# Patient Record
Sex: Female | Born: 1955
Health system: Southern US, Community
[De-identification: ages and names within clinical notes are randomized; demographics above are authoritative.]

## PROBLEM LIST (undated history)

## (undated) DIAGNOSIS — I10 Essential (primary) hypertension: Secondary | ICD-10-CM

## (undated) DIAGNOSIS — D125 Benign neoplasm of sigmoid colon: Secondary | ICD-10-CM

## (undated) DIAGNOSIS — D126 Benign neoplasm of colon, unspecified: Secondary | ICD-10-CM

## (undated) HISTORY — DX: Benign neoplasm of colon, unspecified: D12.6

## (undated) HISTORY — DX: Benign neoplasm of sigmoid colon: D12.5

## (undated) HISTORY — PX: BUNIONECTOMY: SHX129

---

## 1982-06-09 HISTORY — PX: HAND SURGERY: SHX662

## 2006-05-13 ENCOUNTER — Ambulatory Visit: Payer: Self-pay | Admitting: Family Medicine

## 2007-07-05 ENCOUNTER — Ambulatory Visit: Payer: Self-pay | Admitting: Gastroenterology

## 2009-01-16 ENCOUNTER — Ambulatory Visit: Payer: Self-pay | Admitting: Family Medicine

## 2010-06-18 LAB — TSH: TSH: 2.31 u[IU]/mL (ref <=5.90)

## 2010-07-03 ENCOUNTER — Ambulatory Visit: Payer: Self-pay | Admitting: Family Medicine

## 2012-08-02 LAB — CBC AND DIFFERENTIAL
HCT: 42 % (ref 36–46)
HEMOGLOBIN: 13.9 g/dL (ref 12.0–16.0)
PLATELETS: 214 10*3/uL (ref 150–399)
WBC: 5.8 10^3/mL

## 2012-09-16 ENCOUNTER — Ambulatory Visit: Payer: Self-pay | Admitting: Family Medicine

## 2012-10-16 ENCOUNTER — Emergency Department: Payer: Self-pay | Admitting: Emergency Medicine

## 2013-10-14 ENCOUNTER — Ambulatory Visit: Payer: Self-pay | Admitting: Family Medicine

## 2014-08-16 LAB — HM PAP SMEAR: HM PAP: NEGATIVE

## 2014-08-18 LAB — LIPID PANEL
Cholesterol: 195 mg/dL (ref 0–200)
HDL: 53 mg/dL (ref 35–70)
LDL CALC: 127 mg/dL
Triglycerides: 75 mg/dL (ref 40–160)

## 2014-08-18 LAB — BASIC METABOLIC PANEL
BUN: 10 mg/dL (ref 4–21)
Creatinine: 0.8 mg/dL (ref 0.5–1.1)
GLUCOSE: 94 mg/dL
Potassium: 4.1 mmol/L (ref 3.4–5.3)
Sodium: 145 mmol/L (ref 137–147)

## 2014-09-15 ENCOUNTER — Ambulatory Visit
Admit: 2014-09-15 | Disposition: A | Discharge status: Home / Self Care | Payer: Self-pay | Attending: Gastroenterology | Admitting: Gastroenterology

## 2014-09-15 LAB — HM COLONOSCOPY

## 2014-12-25 ENCOUNTER — Telehealth: Payer: Self-pay

## 2014-12-25 NOTE — Telephone Encounter (Signed)
-----   Message from Otilio Jefferson sent at 11/27/2014  2:08 PM EDT ----- Regarding: Re: Incoming Fax Sent: 12/25/2014  Phone Encounter: 09/08/2014  Make sure pt has been scheduled for a flex sigmoid per last colonoscopy. GF  > From: Hazyl Marseille > To: Ralene Gasparyan > Sent: 09/26/2014 3:23 PM > Left message with pt's husband for pt to return my call. GF  > From: Lucilla Lame MD > To: Terris Bodin > Sent: 09/20/2014 10:30 AM > Let the pt know the pathology of the colon polyp(s)showed an adenoma. A repeat colonoscopy is needed in 3 MONTHS.

## 2014-12-25 NOTE — Telephone Encounter (Signed)
LVM for pt to call and schedule 3 month repeat flex sigmoid per last colonoscopy. Mailed letter.

## 2014-12-25 NOTE — Telephone Encounter (Signed)
-----   Message from Otilio Jefferson sent at 11/27/2014  2:08 PM EDT ----- Regarding: Re: Incoming Fax Sent: 12/25/2014  Phone Encounter: 09/08/2014  Make sure pt has been scheduled for a flex sigmoid per last colonoscopy. GF  > From: Raegen Tarpley > To: Johannah Rozas > Sent: 09/26/2014 3:23 PM > Left message with pt's husband for pt to return my call. GF  > From: Lucilla Lame MD > To: Bronx Brogden > Sent: 09/20/2014 10:30 AM > Let the pt know the pathology of the colon polyp(s)showed an adenoma. A repeat colonoscopy is needed in 3 MONTHS.

## 2014-12-27 ENCOUNTER — Other Ambulatory Visit: Payer: Self-pay | Admitting: Family Medicine

## 2015-07-14 ENCOUNTER — Encounter: Payer: Self-pay | Admitting: Family Medicine

## 2015-07-14 ENCOUNTER — Ambulatory Visit (INDEPENDENT_AMBULATORY_CARE_PROVIDER_SITE_OTHER): Payer: BC Managed Care – PPO | Admitting: Family Medicine

## 2015-07-14 VITALS — BP 132/78 | HR 80 | Temp 98.6°F | Resp 16 | Wt 206.0 lb

## 2015-07-14 DIAGNOSIS — J0110 Acute frontal sinusitis, unspecified: Secondary | ICD-10-CM

## 2015-07-14 DIAGNOSIS — I10 Essential (primary) hypertension: Secondary | ICD-10-CM

## 2015-07-14 DIAGNOSIS — J019 Acute sinusitis, unspecified: Secondary | ICD-10-CM | POA: Insufficient documentation

## 2015-07-14 MED ORDER — AMOXICILLIN-POT CLAVULANATE 875-125 MG PO TABS
1.0000 | ORAL_TABLET | Freq: Two times a day (BID) | ORAL | Status: DC
Start: 2015-07-14 — End: 2015-08-27

## 2015-07-14 NOTE — Progress Notes (Signed)
Patient ID: Nicole Tyler, female   DOB: Apr 20, 1956, 61 y.o.   MRN: [id number]       Patient: Nicole Tyler Female    DOB: 1956/03/27   60 y.o.   MRN: [id number] Visit Date: 07/14/2015  Today's Provider: Margarita Rana, MD   Chief Complaint  Patient presents with  . Sinusitis    X 2 days.    Subjective:    Sinusitis This is a new problem. The current episode started in the past 7 days. The problem has been gradually worsening since onset. There has been no fever. The pain is mild. Associated symptoms include congestion, ear pain (faciial pain is the worrst. ), headaches (frontal. ), a hoarse voice, neck pain (hurting in her shoulder. ), sinus pressure, sneezing and a sore throat. Pertinent negatives include no chills, coughing, shortness of breath or swollen glands. Past treatments include oral decongestants. The treatment provided no relief.       No Known Allergies Previous Medications   AMLODIPINE-BENAZEPRIL (LOTREL) 5-40 MG PER CAPSULE    TAKE ONE CAPSULE BY MOUTH EVERY DAY    Review of Systems  Constitutional: Positive for fatigue. Negative for chills, activity change and appetite change.  HENT: Positive for congestion, ear pain (faciial pain is the worrst. ), hoarse voice, sinus pressure, sneezing and sore throat. Rhinorrhea: yelllow.   Respiratory: Negative for cough and shortness of breath.   Cardiovascular: Negative.   Musculoskeletal: Positive for neck pain (hurting in her shoulder. ).  Neurological: Positive for headaches (frontal. ).    Social History  Substance Use Topics  . Smoking status: Never Smoker   . Smokeless tobacco: Not on file  . Alcohol Use: No   Objective:   BP 132/78 mmHg  Pulse 80  Temp(Src) 98.6 F (37 C)  Resp 16  Wt 206 lb (93.441 kg)  SpO2 97%  Physical Exam  Constitutional: She is oriented to person, place, and time. She appears well-developed and well-nourished.  HENT:  Head: Normocephalic and atraumatic.  Right Ear: Tympanic  membrane and external ear normal.  Left Ear: Tympanic membrane and external ear normal.  Nose: Mucosal edema and rhinorrhea present. Right sinus exhibits maxillary sinus tenderness. Left sinus exhibits maxillary sinus tenderness.  Mouth/Throat: Uvula is midline and oropharynx is clear and moist.  Eyes: Conjunctivae and EOM are normal. Pupils are equal, round, and reactive to light.  Neck: Normal range of motion. Neck supple.  Cardiovascular: Normal rate and regular rhythm.   Pulmonary/Chest: Effort normal and breath sounds normal. She has no wheezes. She has no rales.  Neurological: She is alert and oriented to person, place, and time.      Assessment & Plan:     1. Acute frontal sinusitis, recurrence not specified New problem. Condition is worsening. Will start medication for better control.  Patient instructed to call back if condition worsens or does not improve.    - amoxicillin-clavulanate (AUGMENTIN) 875-125 MG tablet; Take 1 tablet by mouth 2 (two) times daily.  Dispense: 20 tablet; Refill: 0  2. Essential hypertension Stable Follow up with Dr. Caryn Section for CPE. Warned about not taking decongestants with pseudophed as may increase BP.      Margarita Rana, MD  [facility]

## 2015-07-20 DIAGNOSIS — Z8614 Personal history of Methicillin resistant Staphylococcus aureus infection: Secondary | ICD-10-CM | POA: Insufficient documentation

## 2015-07-20 DIAGNOSIS — Z86010 Personal history of colonic polyps: Secondary | ICD-10-CM | POA: Insufficient documentation

## 2015-07-20 DIAGNOSIS — L304 Erythema intertrigo: Secondary | ICD-10-CM | POA: Insufficient documentation

## 2015-08-27 ENCOUNTER — Ambulatory Visit (INDEPENDENT_AMBULATORY_CARE_PROVIDER_SITE_OTHER): Payer: BC Managed Care – PPO | Admitting: Family Medicine

## 2015-08-27 ENCOUNTER — Encounter: Payer: Self-pay | Admitting: Family Medicine

## 2015-08-27 VITALS — BP 112/74 | HR 81 | Temp 98.4°F | Resp 16 | Ht 64.0 in | Wt 208.0 lb

## 2015-08-27 DIAGNOSIS — Z0000 Encounter for general adult medical examination without abnormal findings: Secondary | ICD-10-CM

## 2015-08-27 DIAGNOSIS — Z86010 Personal history of colonic polyps: Secondary | ICD-10-CM | POA: Diagnosis not present

## 2015-08-27 DIAGNOSIS — Z23 Encounter for immunization: Secondary | ICD-10-CM | POA: Diagnosis not present

## 2015-08-27 DIAGNOSIS — E669 Obesity, unspecified: Secondary | ICD-10-CM | POA: Insufficient documentation

## 2015-08-27 DIAGNOSIS — I10 Essential (primary) hypertension: Secondary | ICD-10-CM

## 2015-08-27 NOTE — Progress Notes (Signed)
Patient: Nicole Tyler, Female    DOB: Jan 26, 1956, 60 y.o.   MRN: [id number] Visit Date: 08/27/2015  Today's Provider: Lelon Huh, MD   Chief Complaint  Patient presents with  . Annual Exam  . Hypertension   Subjective:    Annual physical exam Nicole Tyler is a 60 y.o. female who presents today for health maintenance and complete physical. She feels well. She reports exercising none. She reports she is sleeping fairly well.  -----------------------------------------------------------------      Hypertension, follow-up:  BP Readings from Last 3 Encounters:  08/27/15 112/74  07/20/15 130/80  07/14/15 132/78    She was last seen for hypertension 1 months ago.  BP at that visit was 132/78. Management since that visit includes; no changes.She reports good compliance with treatment. She is not having side effects. none  She is not exercising. She is not adherent to low salt diet.   Outside blood pressures are normal. She is experiencing none.  Patient denies none.   Cardiovascular risk factors include none.  Use of agents associated with hypertension: none.   ----------------------------------------------------------------------    Review of Systems  Constitutional: Negative.   HENT: Negative.   Eyes: Negative.   Respiratory: Positive for cough (nearly resolved, getting over URI).   Cardiovascular: Positive for leg swelling.  Gastrointestinal: Negative.   Endocrine: Negative.   Genitourinary: Negative.   Musculoskeletal: Negative.   Skin: Negative.   Allergic/Immunologic: Negative.   Neurological: Negative.   Hematological: Negative.   Psychiatric/Behavioral: Negative.     Social History      She  reports that she has never smoked. She does not have any smokeless tobacco history on file. She reports that she does not drink alcohol.       Social History   Social History  . Marital Status: Married    Spouse Name: N/A  . Number of Children:  N/A  . Years of Education: N/A   Occupational History  .      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 Smoking status: Never Smoker   . Smokeless tobacco: None  . Alcohol Use: No  . Drug Use: None  . Sexual Activity: Not Asked   Other Topics Concern  . None   Social History Narrative    History reviewed. No pertinent past medical history.   Patient Active Problem List   Diagnosis Date Noted  . H/O adenomatous polyp of colon 07/20/2015  . Personal history of methicillin resistant Staphylococcus aureus 07/20/2015  . Eczema intertrigo 07/20/2015  . Essential (primary) hypertension 02/19/2006    Past Surgical History  Procedure Laterality Date  . Hand surgery  1984    has repair to lt hand secondary to WC injury caught in machine rollers.    Family History        Family Status  Relation Status Death Age  . Mother Deceased 41  . Father Deceased 46    Died of colon cancer  . Sister Alive   . Daughter Alive   . Maternal Grandmother Deceased   . Daughter Alive   . Sister Alive   . Sister Alive   . Sister Alive   . Sister Alive         Her family history includes Asthma in her daughter; Colon cancer in her father; Hypertension in her daughter, father, maternal grandmother, and mother; Stroke in her maternal grandmother.    No Known Allergies  Previous Medications   AMLODIPINE-BENAZEPRIL (LOTREL) 5-40 MG PER CAPSULE    TAKE ONE CAPSULE BY MOUTH EVERY DAY   ASPIRIN 81 MG TABLET    Take by mouth.    Patient Care Team: Birdie Sons, MD as PCP - General (Family Medicine)     Objective:   Vitals: BP 112/74 mmHg  Pulse 81  Temp(Src) 98.4 F (36.9 C) (Oral)  Resp 16  Ht 5\' 4"  (1.626 m)  Wt 208 lb (94.348 kg)  BMI 35.69 kg/m2   Physical Exam   General Appearance:    Alert, cooperative, no distress, appears stated age, obese  Head:    Normocephalic, without obvious abnormality, atraumatic  Eyes:    PERRL, conjunctiva/corneas  clear, EOM's intact, fundi    benign, both eyes  Ears:    Normal TM's and external ear canals, both ears  Nose:   Nares normal, septum midline, mucosa normal, no drainage    or sinus tenderness  Throat:   Lips, mucosa, and tongue normal; teeth and gums normal  Neck:   Supple, symmetrical, trachea midline, no adenopathy;    thyroid:  no enlargement/tenderness/nodules; no carotid   bruit or JVD  Back:     Symmetric, no curvature, ROM normal, no CVA tenderness  Lungs:     Clear to auscultation bilaterally, respirations unlabored  Chest Wall:    No tenderness or deformity   Heart:    Regular rate and rhythm, S1 and S2 normal, no murmur, rub   or gallop  Breast Exam:    normal appearance, no masses or tenderness  Abdomen:     Soft, non-tender, bowel sounds active all four quadrants,    no masses, no organomegaly  Pelvic:    deferred  Extremities:   Extremities normal, atraumatic, no cyanosis or edema  Pulses:   2+ and symmetric all extremities  Skin:   Skin color, texture, turgor normal, no rashes or lesions  Lymph nodes:   Cervical, supraclavicular, and axillary nodes normal  Neurologic:   CNII-XII intact, normal strength, sensation and reflexes    throughout    Depression Screen PHQ 2/9 Scores 08/27/2015  PHQ - 2 Score 0  PHQ- 9 Score 2      Assessment & Plan:     Routine Health Maintenance and Physical Exam  Exercise Activities and Dietary recommendations Goals    None      Immunization History  Administered Date(s) Administered  . Tdap 08/16/2014    Health Maintenance  Topic Date Due  . Hepatitis C Screening  03/26/1956  . HIV Screening  07/24/1970  . MAMMOGRAM  07/24/2005  . ZOSTAVAX  07/25/2015  . INFLUENZA VACCINE  02/06/2017 (Originally 01/08/2015)  . COLONOSCOPY  09/16/2014  . PAP SMEAR  08/16/2019  . TETANUS/TDAP  08/15/2024      Discussed health benefits of physical activity, and encouraged her to engage in regular exercise appropriate for her age and  condition.    --------------------------------------------------------------------  1. Annual physical exam  - Comprehensive metabolic panel - Lipid panel  2. Essential hypertension Well controlled.  Continue current medications.   - Lipid panel - EKG 12-Lead  3. H/O adenomatous polyp of colon She was supposed to have follow up flex sig last summer, but was not scheduled. Will refer back to GI to determine if she needs follow up Sig or colonoscopy.  - Ambulatory referral to Gastroenterology  4. Need for shingles vaccine  - Varicella-zoster vaccine subcutaneous  5. Obesity D&E

## 2015-08-28 LAB — COMPREHENSIVE METABOLIC PANEL
ALT: 15 IU/L (ref 0–32)
AST: 13 IU/L (ref 0–40)
Albumin/Globulin Ratio: 1.3 (ref 1.2–2.2)
Albumin: 4.1 g/dL (ref 3.6–4.8)
Alkaline Phosphatase: 73 IU/L (ref 39–117)
BILIRUBIN TOTAL: 0.5 mg/dL (ref 0.0–1.2)
BUN/Creatinine Ratio: 11 (ref 11–26)
BUN: 8 mg/dL (ref 8–27)
CALCIUM: 9.3 mg/dL (ref 8.7–10.3)
CHLORIDE: 103 mmol/L (ref 96–106)
CO2: 24 mmol/L (ref 18–29)
Creatinine, Ser: 0.73 mg/dL (ref 0.57–1.00)
GFR, EST AFRICAN AMERICAN: 104 mL/min/{1.73_m2} (ref >59)
GFR, EST NON AFRICAN AMERICAN: 90 mL/min/{1.73_m2} (ref >59)
GLUCOSE: 87 mg/dL (ref 65–99)
Globulin, Total: 3.2 g/dL (ref 1.5–4.5)
Potassium: 4.3 mmol/L (ref 3.5–5.2)
Sodium: 143 mmol/L (ref 134–144)
Total Protein: 7.3 g/dL (ref 6.0–8.5)

## 2015-08-28 LAB — LIPID PANEL
Chol/HDL Ratio: 3.8 ratio units (ref 0.0–4.4)
Cholesterol, Total: 207 mg/dL — ABNORMAL HIGH (ref 100–199)
HDL: 55 mg/dL (ref >39)
LDL Calculated: 134 mg/dL — ABNORMAL HIGH (ref 0–99)
TRIGLYCERIDES: 91 mg/dL (ref 0–149)
VLDL CHOLESTEROL CAL: 18 mg/dL (ref 5–40)

## 2015-08-31 ENCOUNTER — Telehealth: Payer: Self-pay

## 2015-08-31 NOTE — Telephone Encounter (Signed)
Gastroenterology Pre-Procedure Review  Request Date:  Requesting Physician: Dr. Caryn Section   PATIENT REVIEW QUESTIONS: The patient responded to the following health history questions as indicated:    1. Are you having any GI issues? no 2. Do you have a personal history of Polyps? yes (64mm in 09/2014) 3. Do you have a family history of Colon Cancer or Polyps? no 4. Diabetes Mellitus? no 5. Joint replacements in the past 12 months?no 6. Major health problems in the past 3 months?no 7. Any artificial heart valves, MVP, or defibrillator?no    MEDICATIONS & ALLERGIES:    Patient reports the following regarding taking any anticoagulation/antiplatelet therapy:   Plavix, Coumadin, Eliquis, Xarelto, Lovenox, Pradaxa, Brilinta, or Effient? no Aspirin? no  Patient confirms/reports the following medications:  Current Outpatient Prescriptions  Medication Sig Dispense Refill  . amLODipine-benazepril (LOTREL) 5-40 MG per capsule TAKE ONE CAPSULE BY MOUTH EVERY DAY 30 capsule 12  . aspirin 81 MG tablet Take by mouth.     No current facility-administered medications for this visit.    Patient confirms/reports the following allergies:  No Known Allergies  No orders of the defined types were placed in this encounter.    AUTHORIZATION INFORMATION Primary Insurance: 1D#: Group #:  Secondary Insurance: 1D#: Group #:  SCHEDULE INFORMATION: Date: TBD Time: Location:

## 2015-09-13 ENCOUNTER — Encounter: Payer: Self-pay | Admitting: *Deleted

## 2015-09-13 ENCOUNTER — Other Ambulatory Visit: Payer: Self-pay

## 2015-09-13 NOTE — Discharge Instructions (Signed)

## 2015-09-17 ENCOUNTER — Encounter
Admission: RE | Disposition: A | Discharge status: Home / Self Care | Payer: Self-pay | Source: Ambulatory Visit | Attending: Gastroenterology

## 2015-09-17 ENCOUNTER — Ambulatory Visit: Payer: BC Managed Care – PPO | Admitting: Anesthesiology

## 2015-09-17 ENCOUNTER — Ambulatory Visit
Admission: RE | Admit: 2015-09-17 | Discharge: 2015-09-17 | Disposition: A | Discharge status: Home / Self Care | Payer: BC Managed Care – PPO | Source: Ambulatory Visit | Attending: Gastroenterology | Admitting: Gastroenterology

## 2015-09-17 DIAGNOSIS — Z79899 Other long term (current) drug therapy: Secondary | ICD-10-CM | POA: Diagnosis not present

## 2015-09-17 DIAGNOSIS — Z860100 Personal history of colon polyps, unspecified: Secondary | ICD-10-CM | POA: Insufficient documentation

## 2015-09-17 DIAGNOSIS — I10 Essential (primary) hypertension: Secondary | ICD-10-CM | POA: Diagnosis not present

## 2015-09-17 DIAGNOSIS — K620 Anal polyp: Secondary | ICD-10-CM | POA: Diagnosis not present

## 2015-09-17 DIAGNOSIS — D125 Benign neoplasm of sigmoid colon: Secondary | ICD-10-CM

## 2015-09-17 DIAGNOSIS — D126 Benign neoplasm of colon, unspecified: Secondary | ICD-10-CM | POA: Insufficient documentation

## 2015-09-17 DIAGNOSIS — Z1211 Encounter for screening for malignant neoplasm of colon: Secondary | ICD-10-CM | POA: Diagnosis not present

## 2015-09-17 DIAGNOSIS — Z800 Family history of malignant neoplasm of digestive organs: Secondary | ICD-10-CM | POA: Diagnosis not present

## 2015-09-17 HISTORY — PX: FLEXIBLE SIGMOIDOSCOPY: SHX5431

## 2015-09-17 HISTORY — PX: POLYPECTOMY: SHX149

## 2015-09-17 HISTORY — DX: Essential (primary) hypertension: I10

## 2015-09-17 SURGERY — SIGMOIDOSCOPY, FLEXIBLE
Anesthesia: Monitor Anesthesia Care | Wound class: Contaminated

## 2015-09-17 MED ORDER — LACTATED RINGERS IV SOLN
INTRAVENOUS | Status: DC
  Administered 2015-09-17: 10:00:00 via INTRAVENOUS

## 2015-09-17 MED ORDER — PROPOFOL 10 MG/ML IV BOLUS
INTRAVENOUS | Status: DC | PRN
  Administered 2015-09-17 (×2): 20 mg via INTRAVENOUS
  Administered 2015-09-17: 10 mg via INTRAVENOUS
  Administered 2015-09-17: 30 mg via INTRAVENOUS
  Administered 2015-09-17: 20 mg via INTRAVENOUS
  Administered 2015-09-17: 10 mg via INTRAVENOUS
  Administered 2015-09-17 (×3): 20 mg via INTRAVENOUS
  Administered 2015-09-17: 60 mg via INTRAVENOUS
  Administered 2015-09-17 (×2): 20 mg via INTRAVENOUS
  Administered 2015-09-17: 10 mg via INTRAVENOUS

## 2015-09-17 MED ORDER — SODIUM CHLORIDE 0.9 % IV SOLN: INTRAVENOUS | Status: DC

## 2015-09-17 MED ORDER — SODIUM CHLORIDE 0.9 % IJ SOLN
INTRAMUSCULAR | Status: DC | PRN
  Administered 2015-09-17: 12 mL via INTRAVENOUS

## 2015-09-17 MED ORDER — LIDOCAINE HCL (CARDIAC) 20 MG/ML IV SOLN
INTRAVENOUS | Status: DC | PRN
  Administered 2015-09-17: 40 mg via INTRAVENOUS

## 2015-09-17 SURGICAL SUPPLY — 22 items
CANISTER SUCT 1200ML W/VALVE (MISCELLANEOUS) ×4 IMPLANT
CLIP HMST 235XBRD CATH ROT (MISCELLANEOUS) ×2 IMPLANT
CLIP RESOLUTION 360 11X235 (MISCELLANEOUS) ×2
FCP ESCP3.2XJMB 240X2.8X (MISCELLANEOUS)
FORCEPS BIOP RAD 4 LRG CAP 4 (CUTTING FORCEPS) IMPLANT
FORCEPS BIOP RJ4 240 W/NDL (MISCELLANEOUS)
FORCEPS ESCP3.2XJMB 240X2.8X (MISCELLANEOUS) IMPLANT
GOWN CVR UNV OPN BCK APRN NK (MISCELLANEOUS) ×4 IMPLANT
GOWN ISOL THUMB LOOP REG UNIV (MISCELLANEOUS) ×4
INJECTOR VARIJECT VIN23 (MISCELLANEOUS) ×2 IMPLANT
KIT DEFENDO VALVE AND CONN (KITS) IMPLANT
KIT ENDO PROCEDURE OLY (KITS) ×4 IMPLANT
MARKER SPOT ENDO TATTOO 5ML (MISCELLANEOUS) IMPLANT
PAD GROUND ADULT SPLIT (MISCELLANEOUS) ×4 IMPLANT
PROBE APC STR FIRE (PROBE) ×4 IMPLANT
SNARE SHORT THROW 13M SML OVAL (MISCELLANEOUS) ×4 IMPLANT
SNARE SHORT THROW 30M LRG OVAL (MISCELLANEOUS) IMPLANT
SNARE SNG USE RND 15MM (INSTRUMENTS) ×4 IMPLANT
SPOT EX ENDOSCOPIC TATTOO (MISCELLANEOUS)
VARIJECT INJECTOR VIN23 (MISCELLANEOUS) ×4
WATER STERILE IRR 250ML POUR (IV SOLUTION) ×4 IMPLANT
WIDE-EYE POLYPTRAP (MISCELLANEOUS) ×4 IMPLANT

## 2015-09-17 NOTE — Anesthesia Postprocedure Evaluation (Signed)
Anesthesia Post Note  Patient: Nicole Tyler  Procedure(s) Performed: Procedure(s) (LRB): FLEXIBLE SIGMOIDOSCOPY (N/A) POLYPECTOMY INTESTINAL  Patient location during evaluation: PACU Anesthesia Type: MAC Level of consciousness: awake and alert Pain management: pain level controlled Vital Signs Assessment: post-procedure vital signs reviewed and stable Respiratory status: spontaneous breathing and respiratory function stable Cardiovascular status: stable Anesthetic complications: no    Jaci Standard, III,  Esbeidy Mclaine D

## 2015-09-17 NOTE — Transfer of Care (Signed)
Immediate Anesthesia Transfer of Care Note  Patient: Nicole Tyler  Procedure(s) Performed: Procedure(s) with comments: FLEXIBLE SIGMOIDOSCOPY (N/A) POLYPECTOMY INTESTINAL - Sigmoid colon polyp ( several pieces from the same polyp)  Hot snare  Patient Location: PACU  Anesthesia Type: MAC  Level of Consciousness: awake, alert  and patient cooperative  Airway and Oxygen Therapy: Patient Spontanous Breathing and Patient connected to supplemental oxygen  Post-op Assessment: Post-op Vital signs reviewed, Patient's Cardiovascular Status Stable, Respiratory Function Stable, Patent Airway and No signs of Nausea or vomiting  Post-op Vital Signs: Reviewed and stable  Complications: No apparent anesthesia complications

## 2015-09-17 NOTE — Anesthesia Procedure Notes (Signed)
Procedure Name: MAC Performed by: Lazer Wollard Pre-anesthesia Checklist: Patient identified, Emergency Drugs available, Suction available, Timeout performed and Patient being monitored Patient Re-evaluated:Patient Re-evaluated prior to inductionOxygen Delivery Method: Nasal cannula Placement Confirmation: positive ETCO2       

## 2015-09-17 NOTE — H&P (Signed)
  [facility]  [address] [place], [place] [address] Phone: [phone] Fax : [phone]  Primary Care Physician:  Lelon Huh, MD Primary Gastroenterologist:  Dr. Allen Norris  Pre-Procedure History & Physical: HPI:  Nicole Tyler is a 60 y.o. female is here for an colonoscopy.   Past Medical History  Diagnosis Date  . Hypertension     Past Surgical History  Procedure Laterality Date  . Hand surgery  1984    has repair to lt hand secondary to WC injury caught in machine rollers.  . Bunionectomy Right     Prior to Admission medications   Medication Sig Start Date End Date Taking? Authorizing Provider  amLODipine-benazepril (LOTREL) 5-40 MG per capsule TAKE ONE CAPSULE BY MOUTH EVERY DAY 12/27/14  Yes Birdie Sons, MD    Allergies as of 09/13/2015  . (No Known Allergies)    Family History  Problem Relation Age of Onset  . Hypertension Mother   . Hypertension Father   . Colon cancer Father   . Asthma Daughter   . Hypertension Maternal Grandmother   . Stroke Maternal Grandmother   . Hypertension Daughter     Social History   Social History  . Marital Status: Married    Spouse Name: N/A  . Number of Children: N/A  . Years of Education: N/A   Occupational History  .      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 Smoking status: Never Smoker   . Smokeless tobacco: Not on file  . Alcohol Use: No  . Drug Use: Not on file  . Sexual Activity: Not on file   Other Topics Concern  . Not on file   Social History Narrative    Review of Systems: See HPI, otherwise negative ROS  Physical Exam: BP 154/76 mmHg  Pulse 64  Temp(Src) 97.5 F (36.4 C) (Temporal)  Resp 16  Ht 5\' 4"  (1.626 m)  Wt 204 lb (92.534 kg)  BMI 35.00 kg/m2  SpO2 100% General:   Alert,  pleasant and cooperative in NAD Head:  Normocephalic and atraumatic. Neck:  Supple; no masses or thyromegaly. Lungs:  Clear throughout to auscultation.      Heart:  Regular rate and rhythm. Abdomen:  Soft, nontender and nondistended. Normal bowel sounds, without guarding, and without rebound.   Neurologic:  Alert and  oriented x4;  grossly normal neurologically.  Impression/Plan: Nicole Tyler is here for an colonoscopy to be performed for large polyp in the sigmoid last year.  Risks, benefits, limitations, and alternatives regarding  colonoscopy have been reviewed with the patient.  Questions have been answered.  All parties agreeable.   Ollen Bowl, MD  09/17/2015, 10:12 AM

## 2015-09-17 NOTE — Op Note (Addendum)
[facility] Patient Name: Nicole Tyler Procedure Date: 09/17/2015 9:44 AM MRN: [id number] Account #: [id number] Date of Birth: 21-Nov-1955 Admit Type: Outpatient Age: 60 Room: [facility] OR ROOM 01 Gender: Female Note Status: Supervisor Override Procedure:            Flexible Sigmoidoscopy Indications:          High risk colon cancer surveillance: Personal history                        of colonic polyps, Patient had a large polyp found 1                        year ago and was told to come back in 3 months. patient                        was notified and did not follow up until now. Providers:            Lucilla Lame, MD Referring MD:         Kirstie Peri. Fisher MD, MD (Referring MD) Complications:        No immediate complications. Procedure:            Pre-Anesthesia Assessment:                       - Prior to the procedure, a History and Physical was                        performed, and patient medications and allergies were                        reviewed. The patient's tolerance of previous                        anesthesia was also reviewed. The risks and benefits of                        the procedure and the sedation options and risks were                        discussed with the patient. All questions were                        answered, and informed consent was obtained. Prior                        Anticoagulants: The patient has taken no previous                        anticoagulant or antiplatelet agents. ASA Grade                        Assessment: II - A patient with mild systemic disease.                        After reviewing the risks and benefits, the patient was                        deemed in satisfactory condition to undergo the  procedure.                       After obtaining informed consent, the scope was passed                        under direct vision. The Olympus GIF H180J endoscope                         (S#: [id number]) was introduced through the anus and                        advanced to the the sigmoid colon. The flexible                        sigmoidoscopy was accomplished without difficulty. The                        patient tolerated the procedure well. The quality of                        the bowel preparation was excellent. Findings:      The perianal and digital rectal examinations were normal.      A 15 mm polyp was found at 15 cm proximal to the anus. The polyp was       sessile. Area was successfully injected with saline for a lift       polypectomy. The polyp was removed with a hot snare. Resection and       retrieval were complete. One hemostatic clip was successfully placed (MR       conditional).      The tattoo from the previous polypectomy was seen. Impression:           - One 15 mm polyp at 15 cm proximal to the anus,                        removed with a hot snare. Resected and retrieved.                        Injected. Clip (MR conditional) was placed.                       - The tattoo from the previous polypectomy was seen Recommendation:       - Repeat flexible sigmoidoscopy in 3 months for                        retreatment. Procedure Code(s):    --- Professional ---                       [phone], Sigmoidoscopy, flexible; with removal of                        tumor(s), polyp(s), or other lesion(s) by snare                        technique                       [id number], Sigmoidoscopy, flexible; with directed  submucosal injection(s), any substance Diagnosis Code(s):    --- Professional ---                       Z86.010, Personal history of colonic polyps                       D12.6, Benign neoplasm of colon, unspecified CPT copyright 2016 American Medical Association. All rights reserved. The codes documented in this report are preliminary and upon coder review may  be revised to meet current compliance requirements. Lucilla Lame,  MD 09/17/2015 11:29:43 AM This report has been signed electronically. Number of Addenda: 0 Note Initiated On: 09/17/2015 9:44 AM Total Procedure Duration: 0 hours 24 minutes 17 seconds       [facility]

## 2015-09-17 NOTE — Anesthesia Preprocedure Evaluation (Signed)
Anesthesia Evaluation  Patient identified by MRN, date of birth, ID band Patient awake    Reviewed: Allergy & Precautions, H&P , NPO status , Patient's Chart, lab work & pertinent test results  Airway Mallampati: II  TM Distance: >3 FB Neck ROM: full    Dental no notable dental hx.    Pulmonary neg pulmonary ROS,    Pulmonary exam normal        Cardiovascular hypertension, On Medications Normal cardiovascular exam     Neuro/Psych    GI/Hepatic negative GI ROS, Neg liver ROS,   Endo/Other  negative endocrine ROS  Renal/GU negative Renal ROS     Musculoskeletal   Abdominal   Peds  Hematology negative hematology ROS (+)   Anesthesia Other Findings   Reproductive/Obstetrics                             Anesthesia Physical Anesthesia Plan  ASA: II  Anesthesia Plan: MAC   Post-op Pain Management:    Induction:   Airway Management Planned:   Additional Equipment:   Intra-op Plan:   Post-operative Plan:   Informed Consent: I have reviewed the patients History and Physical, chart, labs and discussed the procedure including the risks, benefits and alternatives for the proposed anesthesia with the patient or authorized representative who has indicated his/her understanding and acceptance.     Plan Discussed with: CRNA  Anesthesia Plan Comments:         Anesthesia Quick Evaluation

## 2015-09-18 ENCOUNTER — Encounter: Payer: Self-pay | Admitting: Gastroenterology

## 2015-09-20 ENCOUNTER — Telehealth: Payer: Self-pay

## 2015-09-20 NOTE — Telephone Encounter (Signed)
-----   Message from Lucilla Lame, MD sent at 09/19/2015  8:34 AM EDT ----- Let the patient know that the tissue was the precancerous type and she needs a repeat flex sig in 3 months.

## 2015-09-20 NOTE — Telephone Encounter (Signed)
Pt notified of results

## 2015-10-22 ENCOUNTER — Encounter: Payer: Self-pay | Admitting: Gastroenterology

## 2015-10-25 ENCOUNTER — Encounter: Payer: Self-pay | Admitting: Physician Assistant

## 2015-10-25 ENCOUNTER — Ambulatory Visit (INDEPENDENT_AMBULATORY_CARE_PROVIDER_SITE_OTHER): Payer: BC Managed Care – PPO | Admitting: Physician Assistant

## 2015-10-25 VITALS — BP 140/88 | HR 105 | Temp 100.5°F | Resp 16 | Ht 64.0 in | Wt 208.0 lb

## 2015-10-25 DIAGNOSIS — H66002 Acute suppurative otitis media without spontaneous rupture of ear drum, left ear: Secondary | ICD-10-CM

## 2015-10-25 DIAGNOSIS — J0140 Acute pansinusitis, unspecified: Secondary | ICD-10-CM | POA: Diagnosis not present

## 2015-10-25 MED ORDER — AMOXICILLIN-POT CLAVULANATE 875-125 MG PO TABS: 1.0000 | ORAL_TABLET | Freq: Two times a day (BID) | ORAL | Status: DC

## 2015-10-25 NOTE — Progress Notes (Signed)
Patient: Nicole Tyler Female    DOB: 1956/03/11   59 y.o.   MRN: [id number] Visit Date: 10/25/2015  Today's Provider: Mar Daring, PA-C   Chief Complaint  Patient presents with  . Sinusitis   Subjective:    Sinusitis This is a new problem. The current episode started in the past 7 days (2 days ago). The problem has been gradually worsening since onset. The maximum temperature recorded prior to her arrival was 100.4 - 100.9 F. The fever has been present for less than 1 day. Associated symptoms include congestion, coughing, headaches, a hoarse voice, shortness of breath, sinus pressure and a sore throat. Pertinent negatives include no chills, diaphoresis, ear pain, neck pain or swollen glands. Treatments tried: allergy otc. The treatment provided no relief.   Sore throat started 2 nights ago. Symptoms of sinus pressure and burning. Cough, low-grade fever, body aches.      No Known Allergies Previous Medications   AMLODIPINE-BENAZEPRIL (LOTREL) 5-40 MG PER CAPSULE    TAKE ONE CAPSULE BY MOUTH EVERY DAY    Review of Systems  Constitutional: Negative for fever, chills, diaphoresis, appetite change and fatigue.  HENT: Positive for congestion, hoarse voice, postnasal drip, sinus pressure and sore throat. Negative for ear pain.   Respiratory: Positive for cough and shortness of breath. Negative for chest tightness.   Cardiovascular: Negative for chest pain and palpitations.  Gastrointestinal: Negative for nausea, vomiting and abdominal pain.  Musculoskeletal: Positive for myalgias. Negative for back pain and neck pain.  Neurological: Positive for headaches. Negative for dizziness and weakness.    Social History  Substance Use Topics  . Smoking status: Never Smoker   . Smokeless tobacco: Not on file  . Alcohol Use: No   Objective:   BP 140/88 mmHg  Pulse 105  Temp(Src) 100.5 F (38.1 C) (Oral)  Resp 16  Ht 5\' 4"  (1.626 m)  Wt 208 lb (94.348 kg)  BMI 35.69  kg/m2  SpO2 95%  Physical Exam  Constitutional: She appears well-developed and well-nourished. No distress.  HENT:  Head: Normocephalic and atraumatic.  Right Ear: Hearing, external ear and ear canal normal. Tympanic membrane is not erythematous and not bulging. A middle ear effusion is present.  Left Ear: Hearing, external ear and ear canal normal. Tympanic membrane is erythematous and bulging. A middle ear effusion is present.  Nose: Mucosal edema and rhinorrhea present. Right sinus exhibits maxillary sinus tenderness and frontal sinus tenderness. Left sinus exhibits maxillary sinus tenderness and frontal sinus tenderness.  Mouth/Throat: Uvula is midline and mucous membranes are normal. Posterior oropharyngeal erythema present. No oropharyngeal exudate or posterior oropharyngeal edema.  Eyes: Conjunctivae are normal. Pupils are equal, round, and reactive to light. Right eye exhibits no discharge. Left eye exhibits no discharge. No scleral icterus.  Neck: Normal range of motion. Neck supple. No tracheal deviation present. No thyromegaly present.  Cardiovascular: Normal rate, regular rhythm and normal heart sounds.  Exam reveals no gallop and no friction rub.   No murmur heard. Pulmonary/Chest: Effort normal and breath sounds normal. No stridor. No respiratory distress. She has no wheezes. She has no rales.  Lymphadenopathy:    She has no cervical adenopathy.  Skin: Skin is warm and dry. She is not diaphoretic.  Vitals reviewed.       Assessment & Plan:     1. Acute pansinusitis, recurrence not specified Worsening symptoms with fever that have not responded to over-the-counter medications. I will treat with  Augmentin as below. She may use Mucinex DM for congestion and cough. She's to make sure to stay well-hydrated and get plenty of rest. She may take Tylenol and/or ibuprofen intermittently as needed for body aches and fever. She is to call the office if symptoms fail to improve or  worsen. - amoxicillin-clavulanate (AUGMENTIN) 875-125 MG tablet; Take 1 tablet by mouth 2 (two) times daily.  Dispense: 20 tablet; Refill: 0  2. Acute suppurative otitis media of left ear without spontaneous rupture of tympanic membrane, recurrence not specified See above medical treatment plan. - amoxicillin-clavulanate (AUGMENTIN) 875-125 MG tablet; Take 1 tablet by mouth 2 (two) times daily.  Dispense: 20 tablet; Refill: 0       Mar Daring, PA-C  [facility]

## 2015-10-25 NOTE — Patient Instructions (Signed)
Otitis Media With Effusion Otitis media with effusion is the presence of fluid in the middle ear. This is a common problem in children, which often follows ear infections. It may be present for weeks or longer after the infection. Unlike an acute ear infection, otitis media with effusion refers only to fluid behind the ear drum and not infection. Children with repeated ear and sinus infections and allergy problems are the most likely to get otitis media with effusion. CAUSES  The most frequent cause of the fluid buildup is dysfunction of the eustachian tubes. These are the tubes that drain fluid in the ears to the back of the nose (nasopharynx). SYMPTOMS   The main symptom of this condition is hearing loss. As a result, you or your child may:  Listen to the TV at a loud volume.  Not respond to questions.  Ask "what" often when spoken to.  Mistake or confuse one sound or word for another.  There may be a sensation of fullness or pressure but usually not pain. DIAGNOSIS   Your health care provider will diagnose this condition by examining you or your child's ears.  Your health care provider may test the pressure in you or your child's ear with a tympanometer.  A hearing test may be conducted if the problem persists. TREATMENT   Treatment depends on the duration and the effects of the effusion.  Antibiotics, decongestants, nose drops, and cortisone-type drugs (tablets or nasal spray) may not be helpful.  Children with persistent ear effusions may have delayed language or behavioral problems. Children at risk for developmental delays in hearing, learning, and speech may require referral to a specialist earlier than children not at risk.  You or your child's health care provider may suggest a referral to an ear, nose, and throat surgeon for treatment. The following may help restore normal hearing:  Drainage of fluid.  Placement of ear tubes (tympanostomy tubes).  Removal of adenoids  (adenoidectomy). HOME CARE INSTRUCTIONS   Avoid secondhand smoke.  Infants who are breastfed are less likely to have this condition.  Avoid feeding infants while they are lying flat.  Avoid known environmental allergens.  Avoid people who are sick. SEEK MEDICAL CARE IF:   Hearing is not better in 3 months.  Hearing is worse.  Ear pain.  Drainage from the ear.  Dizziness. MAKE SURE YOU:   Understand these instructions.  Will watch your condition.  Will get help right away if you are not doing well or get worse.   This information is not intended to replace advice given to you by your health care provider. Make sure you discuss any questions you have with your health care provider.   Document Released: 07/03/2004 Document Revised: 06/16/2014 Document Reviewed: 12/21/2012 Elsevier Interactive Patient Education 2016 Elsevier Inc[employer] Sinusitis, Adult Sinusitis is redness, soreness, and inflammation of the paranasal sinuses. Paranasal sinuses are air pockets within the bones of your face. They are located beneath your eyes, in the middle of your forehead, and above your eyes. In healthy paranasal sinuses, mucus is able to drain out, and air is able to circulate through them by way of your nose. However, when your paranasal sinuses are inflamed, mucus and air can become trapped. This can allow bacteria and other germs to grow and cause infection. Sinusitis can develop quickly and last only a short time (acute) or continue over a long period (chronic). Sinusitis that lasts for more than 12 weeks is considered chronic. CAUSES Causes of sinusitis   include:  Allergies.  Structural abnormalities, such as displacement of the cartilage that separates your nostrils (deviated septum), which can decrease the air flow through your nose and sinuses and affect sinus drainage.  Functional abnormalities, such as when the small hairs (cilia) that line your sinuses and help remove mucus do not work  properly or are not present. SIGNS AND SYMPTOMS Symptoms of acute and chronic sinusitis are the same. The primary symptoms are pain and pressure around the affected sinuses. Other symptoms include:  Upper toothache.  Earache.  Headache.  Bad breath.  Decreased sense of smell and taste.  A cough, which worsens when you are lying flat.  Fatigue.  Fever.  Thick drainage from your nose, which often is green and may contain pus (purulent).  Swelling and warmth over the affected sinuses. DIAGNOSIS Your health care provider will perform a physical exam. During your exam, your health care provider may perform any of the following to help determine if you have acute sinusitis or chronic sinusitis:  Look in your nose for signs of abnormal growths in your nostrils (nasal polyps).  Tap over the affected sinus to check for signs of infection.  View the inside of your sinuses using an imaging device that has a light attached (endoscope). If your health care provider suspects that you have chronic sinusitis, one or more of the following tests may be recommended:  Allergy tests.  Nasal culture. A sample of mucus is taken from your nose, sent to a lab, and screened for bacteria.  Nasal cytology. A sample of mucus is taken from your nose and examined by your health care provider to determine if your sinusitis is related to an allergy. TREATMENT Most cases of acute sinusitis are related to a viral infection and will resolve on their own within 10 days. Sometimes, medicines are prescribed to help relieve symptoms of both acute and chronic sinusitis. These may include pain medicines, decongestants, nasal steroid sprays, or saline sprays. However, for sinusitis related to a bacterial infection, your health care provider will prescribe antibiotic medicines. These are medicines that will help kill the bacteria causing the infection. Rarely, sinusitis is caused by a fungal infection. In these cases,  your health care provider will prescribe antifungal medicine. For some cases of chronic sinusitis, surgery is needed. Generally, these are cases in which sinusitis recurs more than 3 times per year, despite other treatments. HOME CARE INSTRUCTIONS  Drink plenty of water. Water helps thin the mucus so your sinuses can drain more easily.  Use a humidifier.  Inhale steam 3-4 times a day (for example, sit in the bathroom with the shower running).  Apply a warm, moist washcloth to your face 3-4 times a day, or as directed by your health care provider.  Use saline nasal sprays to help moisten and clean your sinuses.  Take medicines only as directed by your health care provider.  If you were prescribed either an antibiotic or antifungal medicine, finish it all even if you start to feel better. SEEK IMMEDIATE MEDICAL CARE IF:  You have increasing pain or severe headaches.  You have nausea, vomiting, or drowsiness.  You have swelling around your face.  You have vision problems.  You have a stiff neck.  You have difficulty breathing.   This information is not intended to replace advice given to you by your health care provider. Make sure you discuss any questions you have with your health care provider.   Document Released: 05/26/2005 Document Revised: 06/16/2014   Document Reviewed: 06/10/2011 Elsevier Interactive Patient Education 2016 Elsevier Inc.  

## 2015-11-26 ENCOUNTER — Encounter: Payer: Self-pay | Admitting: Family Medicine

## 2015-11-26 ENCOUNTER — Ambulatory Visit
Admission: RE | Admit: 2015-11-26 | Discharge: 2015-11-26 | Disposition: A | Discharge status: Home / Self Care | Payer: BC Managed Care – PPO | Source: Ambulatory Visit | Attending: Family Medicine | Admitting: Family Medicine

## 2015-11-26 ENCOUNTER — Ambulatory Visit (INDEPENDENT_AMBULATORY_CARE_PROVIDER_SITE_OTHER): Payer: BC Managed Care – PPO | Admitting: Family Medicine

## 2015-11-26 VITALS — BP 132/84 | HR 74 | Temp 98.3°F | Resp 16 | Wt 211.0 lb

## 2015-11-26 DIAGNOSIS — M545 Low back pain: Secondary | ICD-10-CM

## 2015-11-26 DIAGNOSIS — M5137 Other intervertebral disc degeneration, lumbosacral region: Secondary | ICD-10-CM | POA: Diagnosis not present

## 2015-11-26 DIAGNOSIS — R1030 Lower abdominal pain, unspecified: Secondary | ICD-10-CM | POA: Insufficient documentation

## 2015-11-26 LAB — POCT URINALYSIS DIPSTICK
GLUCOSE UA: NEGATIVE
KETONES UA: NEGATIVE
Leukocytes, UA: NEGATIVE
Nitrite, UA: NEGATIVE
UROBILINOGEN UA: 0.2
pH, UA: 6

## 2015-11-26 NOTE — Progress Notes (Signed)
       Patient: Nicole Tyler Female    DOB: 04-04-1956   60 y.o.   MRN: [id number] Visit Date: 11/26/2015  Today's Provider: Lelon Huh, MD   Chief Complaint  Patient presents with  . Abdominal Pain   Subjective:    Abdominal Pain This is a new problem. Episode onset: 2-3 weeks ago. The problem occurs intermittently. The problem has been unchanged. Pain location: lower abdomen/ pelvic region; mostly on the left side. The pain is severe. The quality of the pain is sharp. Pertinent negatives include no anorexia, constipation, diarrhea, dysuria, fever, flatus, frequency, headaches, hematuria, melena, myalgias, nausea, vomiting or weight loss.  Has been associated with worsening low back pain. Both pains only occur when she is standing or waling, and usually strike out of the blue with no other clear triggers. Lasts several minutes then eases up.      No Known Allergies Current Meds  Medication Sig  . amLODipine-benazepril (LOTREL) 5-40 MG per capsule TAKE ONE CAPSULE BY MOUTH EVERY DAY    Review of Systems  Constitutional: Negative for fever, chills, weight loss, appetite change and fatigue.  Respiratory: Negative for chest tightness and shortness of breath.   Cardiovascular: Negative for chest pain and palpitations.  Gastrointestinal: Positive for abdominal pain. Negative for nausea, vomiting, diarrhea, constipation, melena, abdominal distention, anorexia and flatus.  Genitourinary: Negative for dysuria, frequency, hematuria and difficulty urinating.  Musculoskeletal: Positive for back pain (achiness and numbness in lower back). Negative for myalgias.  Neurological: Negative for dizziness, weakness and headaches.    Social History  Substance Use Topics  . Smoking status: Never Smoker   . Smokeless tobacco: Not on file  . Alcohol Use: No   Objective:   BP 132/84 mmHg  Pulse 74  Temp(Src) 98.3 F (36.8 C) (Oral)  Resp 16  Wt 211 lb (95.709 kg)  SpO2 97%  Physical  Exam  General Appearance:    Alert, cooperative, no distress  Eyes:    PERRL, conjunctiva/corneas clear, EOM's intact       Lungs:     Clear to auscultation bilaterally, respirations unlabored  Heart:    Regular rate and rhythm  mS:   Tender upper lumbar spine. No gross deformities   Abdomen:   bowel sounds present and normal in all 4 quadrants. No CVA tenderness     Results for orders placed or performed in visit on 11/26/15  POCT urinalysis dipstick  Result Value Ref Range   Color, UA yellow    Clarity, UA clear    Glucose, UA negative    Bilirubin, UA Small    Ketones, UA Negative    Spec Grav, UA >=1.030    Blood, UA Trace (hemolyzed)    pH, UA 6.0    Protein, UA Trace    Urobilinogen, UA 0.2    Nitrite, UA Negative    Leukocytes, UA Negative Negative       Assessment & Plan:     1. Lower abdominal pain Normal abdominal exam and u/a. Area of pain is consistent with L1 radiculopathy. Check XR as below. Consider pelvic ultrasound if XR is not consistent with lumbar spine disease.  - POCT urinalysis dipstick   2. Midline low back pain, with sciatica presence unspecified  - DG Lumbar Spine Complete; Future      Lelon Huh, MD  [facility]

## 2015-11-27 ENCOUNTER — Telehealth: Payer: Self-pay | Admitting: *Deleted

## 2015-11-27 MED ORDER — PREDNISONE 10 MG (21) PO TBPK: ORAL_TABLET | ORAL | Status: DC

## 2015-11-27 NOTE — Telephone Encounter (Signed)
Patient was notified of results. Patient expressed understanding. Rx sent to pharmacy.  

## 2015-11-27 NOTE — Telephone Encounter (Signed)
-----   Message from Birdie Sons, MD sent at 11/27/2015  3:29 PM EDT ----- Nicole Tyler shows some mild disc disease and bone spurs in spine which could be pinching nerves and causing her pain. Recommend 6 day prednisone taper. If not greatly improved in 2-3 days will need lower abdominal and pelvic ultrasound.

## 2015-12-28 ENCOUNTER — Telehealth: Payer: Self-pay

## 2015-12-28 NOTE — Telephone Encounter (Signed)
Left vm to schedule pt for her 3 month repeat flex sigmoid.

## 2015-12-28 NOTE — Telephone Encounter (Signed)
-----   Message from Nicole Tyler, Nicole Tyler sent at 09/20/2015  4:05 PM EDT ----- Pt needs 3 month flex sigmoid repeat due to large polyp in rectum

## 2015-12-30 ENCOUNTER — Other Ambulatory Visit: Payer: Self-pay | Admitting: Family Medicine

## 2016-01-03 ENCOUNTER — Telehealth: Payer: Self-pay | Admitting: Gastroenterology

## 2016-01-03 NOTE — Telephone Encounter (Signed)
Patient called to schedule an appointment for a test but she wasn't sure what it was called. She said flex something. Please call.

## 2016-01-04 NOTE — Telephone Encounter (Signed)
LVM again for pt to return my call to schedule her repeat flex sigmoid.

## 2016-01-08 ENCOUNTER — Other Ambulatory Visit: Payer: Self-pay

## 2016-01-08 NOTE — Telephone Encounter (Signed)
Pt scheduled for a Flexible sigmoidoscopy [id number]) at [facility] on 02/08/16 for hx of colon polyps Z86.010. Please precert.

## 2016-02-04 ENCOUNTER — Encounter: Payer: Self-pay | Admitting: *Deleted

## 2016-02-07 NOTE — Discharge Instructions (Signed)

## 2016-02-08 ENCOUNTER — Encounter
Admission: RE | Disposition: A | Discharge status: Home / Self Care | Payer: Self-pay | Source: Ambulatory Visit | Attending: Gastroenterology

## 2016-02-08 ENCOUNTER — Ambulatory Visit: Payer: BC Managed Care – PPO | Admitting: Anesthesiology

## 2016-02-08 ENCOUNTER — Ambulatory Visit
Admission: RE | Admit: 2016-02-08 | Discharge: 2016-02-08 | Disposition: A | Discharge status: Home / Self Care | Payer: BC Managed Care – PPO | Source: Ambulatory Visit | Attending: Gastroenterology | Admitting: Gastroenterology

## 2016-02-08 DIAGNOSIS — K6389 Other specified diseases of intestine: Secondary | ICD-10-CM | POA: Insufficient documentation

## 2016-02-08 DIAGNOSIS — Z8249 Family history of ischemic heart disease and other diseases of the circulatory system: Secondary | ICD-10-CM | POA: Insufficient documentation

## 2016-02-08 DIAGNOSIS — D125 Benign neoplasm of sigmoid colon: Secondary | ICD-10-CM | POA: Diagnosis not present

## 2016-02-08 DIAGNOSIS — Z79899 Other long term (current) drug therapy: Secondary | ICD-10-CM | POA: Diagnosis not present

## 2016-02-08 DIAGNOSIS — Z86010 Personal history of colonic polyps: Secondary | ICD-10-CM | POA: Insufficient documentation

## 2016-02-08 DIAGNOSIS — Z800 Family history of malignant neoplasm of digestive organs: Secondary | ICD-10-CM | POA: Insufficient documentation

## 2016-02-08 DIAGNOSIS — Z823 Family history of stroke: Secondary | ICD-10-CM | POA: Diagnosis not present

## 2016-02-08 DIAGNOSIS — Z825 Family history of asthma and other chronic lower respiratory diseases: Secondary | ICD-10-CM | POA: Insufficient documentation

## 2016-02-08 DIAGNOSIS — Z1211 Encounter for screening for malignant neoplasm of colon: Secondary | ICD-10-CM | POA: Insufficient documentation

## 2016-02-08 HISTORY — PX: FLEXIBLE SIGMOIDOSCOPY: SHX5431

## 2016-02-08 SURGERY — SIGMOIDOSCOPY, FLEXIBLE
Anesthesia: Monitor Anesthesia Care | Wound class: Contaminated

## 2016-02-08 MED ORDER — PROPOFOL 10 MG/ML IV BOLUS
INTRAVENOUS | Status: DC | PRN
  Administered 2016-02-08: 50 mg via INTRAVENOUS
  Administered 2016-02-08: 10 mg via INTRAVENOUS
  Administered 2016-02-08: 30 mg via INTRAVENOUS
  Administered 2016-02-08 (×2): 20 mg via INTRAVENOUS

## 2016-02-08 MED ORDER — STERILE WATER FOR IRRIGATION IR SOLN
Status: DC | PRN
  Administered 2016-02-08: 09:00:00

## 2016-02-08 MED ORDER — LACTATED RINGERS IV SOLN
INTRAVENOUS | Status: DC
  Administered 2016-02-08: 09:00:00 via INTRAVENOUS

## 2016-02-08 MED ORDER — LIDOCAINE HCL (CARDIAC) 20 MG/ML IV SOLN
INTRAVENOUS | Status: DC | PRN
  Administered 2016-02-08: 50 mg via INTRAVENOUS

## 2016-02-08 SURGICAL SUPPLY — 23 items
CANISTER SUCT 1200ML W/VALVE (MISCELLANEOUS) ×3 IMPLANT
CLIP HMST 235XBRD CATH ROT (MISCELLANEOUS) IMPLANT
CLIP RESOLUTION 360 11X235 (MISCELLANEOUS)
FCP ESCP3.2XJMB 240X2.8X (MISCELLANEOUS)
FORCEPS BIOP RAD 4 LRG CAP 4 (CUTTING FORCEPS) IMPLANT
FORCEPS BIOP RJ4 240 W/NDL (MISCELLANEOUS)
FORCEPS ESCP3.2XJMB 240X2.8X (MISCELLANEOUS) IMPLANT
GOWN CVR UNV OPN BCK APRN NK (MISCELLANEOUS) ×2 IMPLANT
GOWN ISOL THUMB LOOP REG UNIV (MISCELLANEOUS) ×4
INJECTOR VARIJECT VIN23 (MISCELLANEOUS) IMPLANT
KIT DEFENDO VALVE AND CONN (KITS) IMPLANT
KIT ENDO PROCEDURE OLY (KITS) ×3 IMPLANT
MARKER SPOT ENDO TATTOO 5ML (MISCELLANEOUS) IMPLANT
PAD GROUND ADULT SPLIT (MISCELLANEOUS) ×3 IMPLANT
PROBE APC STR FIRE (PROBE) IMPLANT
RETRIEVER NET ROTH 2.5X230 LF (MISCELLANEOUS) ×3 IMPLANT
SNARE SHORT THROW 13M SML OVAL (MISCELLANEOUS) ×3 IMPLANT
SNARE SHORT THROW 30M LRG OVAL (MISCELLANEOUS) IMPLANT
SNARE SNG USE RND 15MM (INSTRUMENTS) IMPLANT
SPOT EX ENDOSCOPIC TATTOO (MISCELLANEOUS)
TRAP ETRAP POLY (MISCELLANEOUS) ×3 IMPLANT
VARIJECT INJECTOR VIN23 (MISCELLANEOUS)
WATER STERILE IRR 250ML POUR (IV SOLUTION) ×3 IMPLANT

## 2016-02-08 NOTE — H&P (Signed)
  Lucilla Lame, MD [facility] [address] [place], [place] [address] Phone: [phone] Fax : [phone]  Primary Care Physician:  Lelon Huh, MD Primary Gastroenterologist:  Dr. Allen Norris  Pre-Procedure History & Physical: HPI:  Nicole Tyler is a 60 y.o. female is here for an flexible sigmoidoscopy.   Past Medical History:  Diagnosis Date  . Hypertension     Past Surgical History:  Procedure Laterality Date  . BUNIONECTOMY Right   . FLEXIBLE SIGMOIDOSCOPY N/A 09/17/2015   Procedure: FLEXIBLE SIGMOIDOSCOPY;  Surgeon: Lucilla Lame, MD;  Location: [facility];  Service: Endoscopy;  Laterality: N/A;  . HAND SURGERY  1984   has repair to lt hand secondary to [facility] injury caught in machine rollers.  Marland Kitchen POLYPECTOMY  09/17/2015   Procedure: POLYPECTOMY INTESTINAL;  Surgeon: Lucilla Lame, MD;  Location: [place];  Service: Endoscopy;;  Sigmoid colon polyp ( several pieces from the same polyp)      Prior to Admission medications   Medication Sig Start Date End Date Taking? Authorizing Provider  amLODipine-benazepril (LOTREL) 5-40 MG capsule TAKE ONE CAPSULE BY MOUTH EVERY DAY 12/30/15  Yes Birdie Sons, MD    Allergies as of 01/08/2016  . (No Known Allergies)    Family History  Problem Relation Age of Onset  . Hypertension Mother   . Hypertension Father   . Colon cancer Father   . Asthma Daughter   . Hypertension Maternal Grandmother   . Stroke Maternal Grandmother   . Hypertension Daughter     Social History   Social History  . Marital status: Married    Spouse name: N/A  . Number of children: N/A  . Years of education: N/A   Occupational History  .      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 Smoking status: Never Smoker  . Smokeless tobacco: Never Used  . Alcohol use No  . Drug use: No  . Sexual activity: Not on file   Other Topics Concern  . Not on file   Social History Narrative  . No narrative on file     Review of Systems: See HPI, otherwise negative ROS  Physical Exam: Ht 5\' 4"  (1.626 m)   Wt 211 lb (95.7 kg)   BMI 36.22 kg/m  General:   Alert,  pleasant and cooperative in NAD Head:  Normocephalic and atraumatic. Neck:  Supple; no masses or thyromegaly. Lungs:  Clear throughout to auscultation.    Heart:  Regular rate and rhythm. Abdomen:  Soft, nontender and nondistended. Normal bowel sounds, without guarding, and without rebound.   Neurologic:  Alert and  oriented x4;  grossly normal neurologically.  Impression/Plan: Nicole Tyler is here for an flexible sigmoidoscopy to be performed for history of polyps  Risks, benefits, limitations, and alternatives regarding  flexible sigmoidoscopy have been reviewed with the patient.  Questions have been answered.  All parties agreeable.   Lucilla Lame, MD  02/08/2016, 7:26 AM

## 2016-02-08 NOTE — Anesthesia Procedure Notes (Signed)
Procedure Name: MAC Performed by: Shaily Librizzi Pre-anesthesia Checklist: Patient identified, Emergency Drugs available, Suction available, Timeout performed and Patient being monitored Patient Re-evaluated:Patient Re-evaluated prior to inductionOxygen Delivery Method: Nasal cannula Placement Confirmation: positive ETCO2       

## 2016-02-08 NOTE — Anesthesia Preprocedure Evaluation (Signed)
Anesthesia Evaluation  Patient identified by MRN, date of birth, ID band Patient awake    Reviewed: Allergy & Precautions, NPO status , Patient's Chart, lab work & pertinent test results  Airway Mallampati: II  TM Distance: >3 FB Neck ROM: Full    Dental no notable dental hx.    Pulmonary neg pulmonary ROS,    Pulmonary exam normal        Cardiovascular hypertension, Pt. on medications Normal cardiovascular exam     Neuro/Psych negative neurological ROS     GI/Hepatic negative GI ROS, Neg liver ROS,   Endo/Other  negative endocrine ROS  Renal/GU negative Renal ROS     Musculoskeletal negative musculoskeletal ROS (+)   Abdominal   Peds  Hematology negative hematology ROS (+)   Anesthesia Other Findings   Reproductive/Obstetrics                             Anesthesia Physical Anesthesia Plan  ASA: II  Anesthesia Plan: MAC   Post-op Pain Management:    Induction: Intravenous  Airway Management Planned:   Additional Equipment:   Intra-op Plan:   Post-operative Plan:   Informed Consent: I have reviewed the patients History and Physical, chart, labs and discussed the procedure including the risks, benefits and alternatives for the proposed anesthesia with the patient or authorized representative who has indicated his/her understanding and acceptance.     Plan Discussed with: CRNA  Anesthesia Plan Comments:         Anesthesia Quick Evaluation

## 2016-02-08 NOTE — Anesthesia Postprocedure Evaluation (Signed)
Anesthesia Post Note  Patient: EDA PELLMAN  Procedure(s) Performed: Procedure(s) (LRB): FLEXIBLE SIGMOIDOSCOPY (N/A)  Patient location during evaluation: PACU Anesthesia Type: MAC Level of consciousness: awake and alert and oriented Pain management: pain level controlled Vital Signs Assessment: post-procedure vital signs reviewed and stable Respiratory status: spontaneous breathing and nonlabored ventilation Cardiovascular status: stable Postop Assessment: no signs of nausea or vomiting and adequate PO intake Anesthetic complications: no    Estill Batten

## 2016-02-08 NOTE — Op Note (Signed)
[facility] Patient Name: Nicole Tyler Procedure Date: 02/08/2016 8:55 AM MRN: [id number] Account #: [id number] Date of Birth: 08/05/1955 Admit Type: Outpatient Age: 60 Room: [facility] OR ROOM 01 Gender: Female Note Status: Finalized Procedure:            Flexible Sigmoidoscopy Providers:            Lucilla Lame MD, MD Referring MD:         Kirstie Peri. Fisher, MD (Referring MD) Medicines:            Propofol per Anesthesia, None Complications:        No immediate complications. Procedure:            Pre-Anesthesia Assessment:                       - Prior to the procedure, a History and Physical was                        performed, and patient medications and allergies were                        reviewed. The patient's tolerance of previous                        anesthesia was also reviewed. The risks and benefits of                        the procedure and the sedation options and risks were                        discussed with the patient. All questions were                        answered, and informed consent was obtained. Prior                        Anticoagulants: The patient has taken no previous                        anticoagulant or antiplatelet agents. ASA Grade                        Assessment: II - A patient with mild systemic disease.                        After reviewing the risks and benefits, the patient was                        deemed in satisfactory condition to undergo the                        procedure.                       After obtaining informed consent, the scope was passed                        under direct vision. The Olympus CF H180AL colonoscope                        (  S#: [id number]) was introduced through the anus and                        advanced to the the sigmoid colon. The flexible                        sigmoidoscopy was accomplished without difficulty. The                        patient tolerated the  procedure well. The quality of                        the bowel preparation was good. Findings:      The perianal and digital rectal examinations were normal.      A scar was found in the sigmoid colon.      A 3 mm polyp was found in the sigmoid colon. The polyp was sessile. The       polyp was removed with a hot snare. Resection and retrieval were       complete.      A tattoo was seen in the sigmoid colon. Impression:           - Scar in the sigmoid colon.                       - One 3 mm polyp in the sigmoid colon, removed with a                        hot snare. Resected and retrieved.                       - A tattoo was seen in the sigmoid colon. Recommendation:       - Await pathology results. Procedure Code(s):    --- Professional ---                       [phone], Sigmoidoscopy, flexible; with removal of                        tumor(s), polyp(s), or other lesion(s) by snare                        technique Diagnosis Code(s):    --- Professional ---                       D12.5, Benign neoplasm of sigmoid colon                       K63.89, Other specified diseases of intestine CPT copyright 2016 American Medical Association. All rights reserved. The codes documented in this report are preliminary and upon coder review may  be revised to meet current compliance requirements. Lucilla Lame MD, MD 02/08/2016 9:24:43 AM This report has been signed electronically. Number of Addenda: 0 Note Initiated On: 02/08/2016 8:55 AM Total Procedure Duration: 0 hours 9 minutes 29 seconds       [facility]

## 2016-02-08 NOTE — Transfer of Care (Signed)
Immediate Anesthesia Transfer of Care Note  Patient: Nicole Tyler  Procedure(s) Performed: Procedure(s): FLEXIBLE SIGMOIDOSCOPY (N/A)  Patient Location: PACU  Anesthesia Type: MAC  Level of Consciousness: awake, alert  and patient cooperative  Airway and Oxygen Therapy: Patient Spontanous Breathing and Patient connected to supplemental oxygen  Post-op Assessment: Post-op Vital signs reviewed, Patient's Cardiovascular Status Stable, Respiratory Function Stable, Patent Airway and No signs of Nausea or vomiting  Post-op Vital Signs: Reviewed and stable  Complications: No apparent anesthesia complications

## 2016-02-12 ENCOUNTER — Encounter: Payer: Self-pay | Admitting: Gastroenterology

## 2016-02-13 ENCOUNTER — Encounter: Payer: Self-pay | Admitting: Gastroenterology

## 2016-02-17 ENCOUNTER — Encounter: Payer: Self-pay | Admitting: Gastroenterology

## 2016-02-19 ENCOUNTER — Encounter: Payer: Self-pay | Admitting: Family Medicine

## 2016-02-19 ENCOUNTER — Ambulatory Visit (INDEPENDENT_AMBULATORY_CARE_PROVIDER_SITE_OTHER): Payer: BC Managed Care – PPO | Admitting: Family Medicine

## 2016-02-19 VITALS — BP 148/88 | HR 77 | Temp 98.1°F | Resp 16 | Ht 64.0 in | Wt 215.0 lb

## 2016-02-19 DIAGNOSIS — Z23 Encounter for immunization: Secondary | ICD-10-CM

## 2016-02-19 DIAGNOSIS — R102 Pelvic and perineal pain: Secondary | ICD-10-CM | POA: Diagnosis not present

## 2016-02-19 LAB — POCT URINALYSIS DIPSTICK
BILIRUBIN UA: NEGATIVE
GLUCOSE UA: NEGATIVE
KETONES UA: NEGATIVE
LEUKOCYTES UA: NEGATIVE
NITRITE UA: NEGATIVE
Protein, UA: NEGATIVE
Spec Grav, UA: 1.015
Urobilinogen, UA: 2
pH, UA: 6

## 2016-02-19 NOTE — Addendum Note (Signed)
Addended by: Julieta Bellini on: 02/19/2016 04:52 PM   Modules accepted: Orders

## 2016-02-19 NOTE — Progress Notes (Signed)
Patient: Nicole Tyler Female    DOB: [birth year]   60 y.o.   MRN: [id number] Visit Date: 02/19/2016  Today's Provider: Lelon Huh, MD   Chief Complaint  Patient presents with  . Follow-up  . Pelvic Pain   Subjective:    Patient has had recurrent pelvis pain for the few months. Pain is on left side of pelvis. Pain is described as moderate. Patient also has pressure in her pelvis also. Patient is not taking anything for the pain.    Pelvic Pain  The patient's primary symptoms include pelvic pain. This is a recurrent problem. The current episode started more than 1 month ago. The problem occurs intermittently. The problem has been unchanged. The pain is moderate. The problem affects the left side. She is not pregnant. Associated symptoms include back pain and chills. Pertinent negatives include no abdominal pain, anorexia, constipation, diarrhea, discolored urine, dysuria, fever, flank pain, frequency, headaches, hematuria, joint pain, joint swelling, nausea, painful intercourse, rash, sore throat, urgency or vomiting. Nothing aggravates the symptoms. She has tried NSAIDs for the symptoms. The treatment provided mild relief. She uses tubal ligation for contraception.   States pain is always left lower pelvic area. It is a sharp shooting pain that lasts about 5 minutes and there are no associated gyn or urological symptoms. No vaginal bleeding. No GI symptoms. She has had no abdominal or pelvic surgery aside from remote history of BTL. She did have uterine fibroids.  She presented with similar symptoms in June and found to have normal u/a, but significant L5-S1 DDD. With possible foraminal stenosis due to spur formation. She was prescribed prednisone which helped her back pain, but not pelvic pain.    No Known Allergies   Current Outpatient Prescriptions:  .  amLODipine-benazepril (LOTREL) 5-40 MG capsule, TAKE ONE CAPSULE BY MOUTH EVERY DAY, Disp: 30 capsule, Rfl: 11  Review of  Systems  Constitutional: Positive for chills. Negative for appetite change, fatigue and fever.  HENT: Negative for sore throat.   Respiratory: Negative for chest tightness and shortness of breath.   Cardiovascular: Negative for chest pain and palpitations.  Gastrointestinal: Negative for abdominal pain, anorexia, constipation, diarrhea, nausea and vomiting.  Genitourinary: Positive for pelvic pain. Negative for dysuria, flank pain, frequency, hematuria and urgency.  Musculoskeletal: Positive for back pain. Negative for joint pain.  Skin: Negative for rash.  Neurological: Negative for dizziness, weakness and headaches.    Social History  Substance Use Topics  . Smoking status: Never Smoker  . Smokeless tobacco: Never Used  . Alcohol use No   Objective:   BP (!) 148/88 (BP Location: Left Arm, Patient Position: Sitting, Cuff Size: Large)   Pulse 77   Temp 98.1 F (36.7 C) (Oral)   Resp 16   Ht 5\' 4"  (1.626 m)   Wt 215 lb (97.5 kg)   SpO2 97%   BMI 36.90 kg/m   Physical Exam  General appearance: alert, well developed, well nourished, cooperative and in no distress Head: Normocephalic, without obvious abnormality, atraumatic Respiratory: Respirations even and unlabored, normal respiratory rate Extremities: No gross deformities Skin: Skin color, texture, turgor normal. No rashes seen  Psych: Appropriate mood and affect. Neurologic: Mental status: Alert, oriented to person, place, and time, thought content appropriate.  Results for orders placed or performed in visit on 02/19/16  POCT urinalysis dipstick  Result Value Ref Range   Color, UA Yellow    Clarity, UA Cloudy    Glucose,  UA Neg    Bilirubin, UA Neg    Ketones, UA Neg    Spec Grav, UA 1.015    Blood, UA Trace    pH, UA 6.0    Protein, UA Neg    Urobilinogen, UA 2.0    Nitrite, UA Neg    Leukocytes, UA Negative Negative       Assessment & Plan:     1. Pelvic pain in female  - POCT urinalysis dipstick -  US Pelvis Complete; Future  2. Need for influenza vaccination  - Flu Vaccine QUAD 36+ mos IM       Lelon Huh, MD  [facility]

## 2016-02-26 ENCOUNTER — Ambulatory Visit
Admission: RE | Admit: 2016-02-26 | Discharge: 2016-02-26 | Disposition: A | Discharge status: Home / Self Care | Payer: BC Managed Care – PPO | Source: Ambulatory Visit | Attending: Family Medicine | Admitting: Family Medicine

## 2016-02-26 DIAGNOSIS — D259 Leiomyoma of uterus, unspecified: Secondary | ICD-10-CM | POA: Insufficient documentation

## 2016-02-26 DIAGNOSIS — R102 Pelvic and perineal pain: Secondary | ICD-10-CM

## 2016-02-27 ENCOUNTER — Telehealth: Payer: Self-pay

## 2016-02-27 ENCOUNTER — Telehealth: Payer: Self-pay | Admitting: Family Medicine

## 2016-02-27 MED ORDER — GABAPENTIN 100 MG PO CAPS: ORAL_CAPSULE | ORAL | 1 refills | Status: DC

## 2016-02-27 NOTE — Telephone Encounter (Signed)
-----   Message from Birdie Sons, MD sent at 02/27/2016  8:13 AM EDT ----- Several small fibroids, but none that should be large enough to cause her pain. I think she her pain is coming from a pinched nerve. Recommend trial of gabapentin. Start 100mg  capsule at bedtime for 4 days, then 2 capsules at bedtime for 4 days, then 3 at bedtime. #90, rf x 1. Follow up in 3-4 weeks. If this isn't helping will need to get CT scan.

## 2016-02-27 NOTE — Telephone Encounter (Signed)
Pt is returning call.  CB#[phone]/MW

## 2016-02-27 NOTE — Telephone Encounter (Signed)
Pt advised and medication sent in. FU scheduled. Renaldo Fiddler, CMA

## 2016-03-19 ENCOUNTER — Ambulatory Visit (INDEPENDENT_AMBULATORY_CARE_PROVIDER_SITE_OTHER): Payer: BC Managed Care – PPO | Admitting: Family Medicine

## 2016-03-19 ENCOUNTER — Encounter: Payer: Self-pay | Admitting: Family Medicine

## 2016-03-19 VITALS — BP 140/90 | HR 76 | Temp 98.1°F | Resp 16 | Wt 214.0 lb

## 2016-03-19 DIAGNOSIS — G8929 Other chronic pain: Secondary | ICD-10-CM

## 2016-03-19 DIAGNOSIS — M5416 Radiculopathy, lumbar region: Secondary | ICD-10-CM

## 2016-03-19 DIAGNOSIS — M545 Low back pain: Secondary | ICD-10-CM

## 2016-03-19 DIAGNOSIS — R102 Pelvic and perineal pain: Secondary | ICD-10-CM

## 2016-03-19 NOTE — Progress Notes (Signed)
       Patient: Nicole Tyler Female    DOB: May 14, 1956   60 y.o.   MRN: [id number] Visit Date: 03/19/2016  Today's Provider: Lelon Huh, MD   Chief Complaint  Patient presents with  . Pelvic Pain    follow up   Subjective:    HPI Follow up Pelvic pain:   Patient continues to have persistent pain in right lower back radiating to right pelvic area which started four months ago.  She was noted to have DDD changes at L5-S1 and possible bony foraminal stenosis due to spur formation on LS spine. Hip articulation was unremarkable. She had improvement with NSAIDs or with prednisone. She had pelvic ultrasound 02/26/2016 showing only uterine fibroids, but no clear etiology for pelvic pain. She was thought to have L1 radiculopathy and started on gabapentin and has titrated up to 3 x 100mg  at bedtime and there has been no improvement with pain. Pain is worse when standing, especially after sitting for prolonged period.     No Known Allergies   Current Outpatient Prescriptions:  .  amLODipine-benazepril (LOTREL) 5-40 MG capsule, TAKE ONE CAPSULE BY MOUTH EVERY DAY, Disp: 30 capsule, Rfl: 11 .  gabapentin (NEURONTIN) 100 MG capsule, Take 1 capsule at bedtime for 4 days, then 2 capsules at bedtime for 4 days, then 3 capsules at bedtime., Disp: 90 capsule, Rfl: 1  Review of Systems  Constitutional: Negative for appetite change, chills, fatigue and fever.  Respiratory: Negative for chest tightness and shortness of breath.   Cardiovascular: Positive for leg swelling. Negative for chest pain and palpitations.  Gastrointestinal: Negative for abdominal pain, nausea and vomiting.  Genitourinary: Positive for pelvic pain.  Musculoskeletal: Positive for back pain and myalgias (both legs).  Neurological: Negative for dizziness and weakness.    Social History  Substance Use Topics  . Smoking status: Never Smoker  . Smokeless tobacco: Never Used  . Alcohol use No   Objective:   BP 140/90 (BP  Location: Right Arm, Patient Position: Sitting, Cuff Size: Large)   Pulse 76   Temp 98.1 F (36.7 C) (Oral)   Resp 16   Wt 214 lb (97.1 kg)   SpO2 96% Comment: room air  BMI 36.73 kg/m   Physical Exam   General Appearance:    Alert, cooperative, no distress  Eyes:    PERRL, conjunctiva/corneas clear, EOM's intact       Lungs:     Clear to auscultation bilaterally, respirations unlabored  Heart:    Regular rate and rhythm  Neurologic:   Awake, alert, oriented x 3. No apparent focal neurological           defect.   MS:    Tender of left lumbar spine and para-lumbar muscles, no SI tenderness. No pain on hip rotation.        Assessment & Plan:     1. Pelvic pain in female Unclear etiology, but suspect lumbar radiculopathy. Unfortunately has failed 3 months of conservative therapy - MR Pelvis W Wo Contrast; Future  2. Chronic left-sided low back pain,  Suspect lumbar nerve impingement. Failed prednisone and gabapentin - MR Lumbar Spine Wo Contrast; Future  3. Lumbar radiculopathy  - MR Lumbar Spine Wo Contrast; Future       Lelon Huh, MD  [facility]

## 2016-03-20 ENCOUNTER — Telehealth: Payer: Self-pay | Admitting: Family Medicine

## 2016-03-20 NOTE — Telephone Encounter (Signed)
Pt's insurance company is requesting a peer to peer to get approval for MRI of lumbar spine and MRI of pelvis.Phone [phone].Member # [id number]

## 2016-03-24 ENCOUNTER — Telehealth: Payer: Self-pay | Admitting: Family Medicine

## 2016-03-24 NOTE — Telephone Encounter (Signed)
Insurance company requires peer to peer to get MRI approved.See previous note

## 2016-03-24 NOTE — Telephone Encounter (Signed)
MRI of lumbar spine only was approved. If this is negative then we can re-order pelvic MRI.   Approval number for lumbar spine MRI is [id number] valid through 04/18/2016  Thanks.

## 2016-03-24 NOTE — Telephone Encounter (Signed)
Please advise 

## 2016-04-05 ENCOUNTER — Ambulatory Visit
Admission: RE | Admit: 2016-04-05 | Discharge: 2016-04-05 | Disposition: A | Discharge status: Home / Self Care | Payer: BC Managed Care – PPO | Source: Ambulatory Visit | Attending: Family Medicine | Admitting: Family Medicine

## 2016-04-05 DIAGNOSIS — M5416 Radiculopathy, lumbar region: Secondary | ICD-10-CM

## 2016-04-05 DIAGNOSIS — M545 Low back pain: Secondary | ICD-10-CM

## 2016-04-05 DIAGNOSIS — M5126 Other intervertebral disc displacement, lumbar region: Secondary | ICD-10-CM | POA: Insufficient documentation

## 2016-04-05 DIAGNOSIS — M5127 Other intervertebral disc displacement, lumbosacral region: Secondary | ICD-10-CM | POA: Insufficient documentation

## 2016-04-05 DIAGNOSIS — M48061 Spinal stenosis, lumbar region without neurogenic claudication: Secondary | ICD-10-CM | POA: Insufficient documentation

## 2016-04-05 DIAGNOSIS — G8929 Other chronic pain: Secondary | ICD-10-CM | POA: Insufficient documentation

## 2016-04-05 DIAGNOSIS — M2578 Osteophyte, vertebrae: Secondary | ICD-10-CM | POA: Diagnosis not present

## 2016-04-10 ENCOUNTER — Telehealth: Payer: Self-pay

## 2016-04-10 DIAGNOSIS — M5418 Radiculopathy, sacral and sacrococcygeal region: Secondary | ICD-10-CM

## 2016-04-10 DIAGNOSIS — R102 Pelvic and perineal pain: Secondary | ICD-10-CM

## 2016-04-10 NOTE — Telephone Encounter (Signed)
Unable to reach patient at this time phone continually rings will try and contact again at a later time. KW

## 2016-04-10 NOTE — Telephone Encounter (Signed)
-----   Message from Birdie Sons, MD sent at 04/10/2016  8:17 AM EDT ----- MRI shows severe disk space narrowing and spurs of vertebra which are probable pinching nerve. I think this is the cause of her pain. This would probably respond to epidural injections. Recommend referral to [facility] in [place] or [facility] in [place]

## 2016-04-11 DIAGNOSIS — R102 Pelvic and perineal pain: Secondary | ICD-10-CM | POA: Insufficient documentation

## 2016-04-11 DIAGNOSIS — M5410 Radiculopathy, site unspecified: Secondary | ICD-10-CM | POA: Insufficient documentation

## 2016-04-11 NOTE — Telephone Encounter (Signed)
Patient was notified of results. Patient stated that she would like to be referred to [facility].

## 2016-04-11 NOTE — Telephone Encounter (Signed)
Please refer to [facility] in [place] for S1 radiculopathy

## 2017-02-05 ENCOUNTER — Ambulatory Visit (INDEPENDENT_AMBULATORY_CARE_PROVIDER_SITE_OTHER): Payer: BC Managed Care – PPO | Admitting: Physician Assistant

## 2017-02-05 ENCOUNTER — Encounter: Payer: Self-pay | Admitting: Physician Assistant

## 2017-02-05 VITALS — BP 160/100 | HR 82 | Temp 98.2°F | Resp 16 | Wt 212.6 lb

## 2017-02-05 DIAGNOSIS — L03012 Cellulitis of left finger: Secondary | ICD-10-CM

## 2017-02-05 DIAGNOSIS — I10 Essential (primary) hypertension: Secondary | ICD-10-CM

## 2017-02-05 MED ORDER — AMLODIPINE BESY-BENAZEPRIL HCL 5-40 MG PO CAPS: 1.0000 | ORAL_CAPSULE | Freq: Every day | ORAL | 11 refills | Status: DC

## 2017-02-05 MED ORDER — CEPHALEXIN 500 MG PO CAPS: 500.0000 mg | ORAL_CAPSULE | Freq: Three times a day (TID) | ORAL | 0 refills | Status: DC

## 2017-02-05 NOTE — Patient Instructions (Signed)
Fingertip Infection  There are two main types of fingertip infections:   Paronychia. This is an infection that happens around your nail. This type of infection can start suddenly in one nail or occur gradually over time and affect more than one nail. Long-term (chronic) paronychia can make your fingernails thick and deformed.   Felon. This is a bacterial infection in the padded tip of your finger. Felon infection can cause a painful collection of pus (abscess) to form inside your fingertip. If the infection is not treated, the infection can spread as deep as the bone.    What are the causes?  Paronychia infection can be caused by bacteria, funguses, or a mix of both. Felon infection is usually caused by the bacteria that are normally found on your skin. An infection can develop if the bacteria spread through your skin to the pad of tissue inside your fingertip.  What increases the risk?  A fingertip infection is more likely to develop in people:   Who have diabetes.   Who have a weak body defense (immune) system.   Who work with their hands.   Whose hands are exposed to moisture, chemicals, or irritants for long periods of time.   Who have poor circulation.   Who bite, chew, or pick their fingernails.    What are the signs or symptoms?  Symptoms of paronychia may affect one or more fingernails and include:   Pain, swelling, and redness around the nail.   Pus-filled pockets at the base or side of the fingernail (cuticle).   Thick fingernails that separate from the nail bed.   Pus that drains from the nail bed.    Symptoms of felon usually affect just one fingertip pad and include:   Severe, throbbing pain.   Redness.   Swelling.   Warmth.   Tenderness when the affected fingertip is touched.    How is this diagnosed?  A fingertip infection is diagnosed with a medical history and physical exam. If there is pus draining from the infection, it may be swabbed and sent to the lab for a culture. An X-ray  may be done to see if the infection has spread to the bone.  How is this treated?  Treatment for a fingertip infection may include:   Warm water or salt-water soaks several times per day.   Antibiotic medicine. This may be an ointment or pills.   Steroid ointment.   Antifungal pills.   Drainage of pus pockets. This is done by making a surgical cut (incision) to open the fingertip to drain pus.   Wearing gloves to protect your nails.    Follow these instructions at home:  Medicines   Take or apply over-the-counter and prescription medicines only as told by your health care provider.   If you were prescribed antibiotic medicine, take or apply it as told by your health care provider. Do not stop using the antibiotic even if your condition improves.  Wound care   Clean the infected area each day with warm water or salt water, or as told by your health care provider.  ? Gently wash the infected area with mild soap and water.  ? Rinse the infected area with water to remove all soap.  ? Pat the infected area dry with a clean towel. Do not rub it.  ? To make a salt-water mixture, completely dissolve -1 tsp of salt in 1 cup of warm water.   Follow instructions from your health care provider   about:  ? How to take care of the infection.  ? When and how you should change your bandage (dressing).  ? When you should remove your dressing.   Check the infected area every day for more signs of infection. Watch for:  ? More redness, swelling, or pain.  ? More fluid or blood.  ? Warmth.  ? A bad smell.  General instructions   Keep the dressing dry until your health care provider says it can be removed. Do not take baths, swim, use a hot tub, or do anything that would put your wound underwater until your health care provider approves.   Raise (elevate) the infected area above the level of your heart while you are sitting or lying down or as told by your health care provider.   Do not scratch or pick at the  infection.   Wear gloves as told by your health care provider, if this applies.   Keep all follow-up visits as told by your health care provider. This is important.  How is this prevented?   Wear gloves when you work with your hands.   Wash your hands often with antibacterial soap.   Avoid letting your hands stay wet or irritated for long periods of time.   Do not bite your fingernails. Do not pull on your cuticles. Do not suck on your fingers.   Use clean scissors or nail clippers to trim your nails. Do not cut your fingernails very short.  Contact a health care provider if:   Your pain medicine is not helping.   You have more redness, swelling, or pain at your fingertip.   You continue to have fluid, blood, or pus coming from your fingertip.   Your infection area feels warm to the touch.   You continue to notice a bad smell coming from your fingertip or your dressing.  Get help right away if:   The area of redness is spreading, or you notice a red streak going away from your fingertip.   You have a fever.  This information is not intended to replace advice given to you by your health care provider. Make sure you discuss any questions you have with your health care provider.  Document Released: 07/03/2004 Document Revised: 11/01/2015 Document Reviewed: 11/13/2014  Elsevier Interactive Patient Education  2018 [employer]

## 2017-02-05 NOTE — Progress Notes (Signed)
       Patient: Nicole Tyler Female    DOB: 26-May-1956   61 y.o.   MRN: [id number] Visit Date: 02/05/2017  Today's Provider: Mar Daring, PA-C   No chief complaint on file.  Subjective:    HPI Patient here with c/o of swelling/purple left middle finger. Reports no injury. She notice the swelling last Friday. Reports is tender to touch. No discharge or redness. Treatment tried: Neosporin ointment. 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    No Known Allergies   Current Outpatient Prescriptions:  .  amLODipine-benazepril (LOTREL) 5-40 MG capsule, TAKE ONE CAPSULE BY MOUTH EVERY DAY, Disp: 30 capsule, Rfl: 11 .  gabapentin (NEURONTIN) 100 MG capsule, Take 1 capsule at bedtime for 4 days, then 2 capsules at bedtime for 4 days, then 3 capsules at bedtime. (Patient not taking: Reported on 02/05/2017), Disp: 90 capsule, Rfl: 1  Review of Systems  Constitutional: Negative.   Respiratory: Negative.   Cardiovascular: Negative.   Gastrointestinal: Negative.   Musculoskeletal: Positive for joint swelling (tip of middle finger).    Social History  Substance Use Topics  . Smoking status: Never Smoker  . Smokeless tobacco: Never Used  . Alcohol use No   Objective:   BP (!) 160/100 (BP Location: Right Arm, Patient Position: Sitting, Cuff Size: Large) Comment: she reports taking her meds sometimes  Pulse 82   Temp 98.2 F (36.8 C) (Oral)   Resp 16   Wt 212 lb 9.6 oz (96.4 kg)   SpO2 98%   BMI 36.49 kg/m     Physical Exam  Constitutional: She appears well-developed and well-nourished. No distress.  Cardiovascular: Normal rate, regular rhythm and normal heart sounds.  Exam reveals no gallop and no friction rub.   No murmur heard. Pulmonary/Chest: Effort normal and breath sounds normal. No respiratory distress. She has no wheezes. She has no rales.  Musculoskeletal:       Hands: Skin: She is not diaphoretic.  Vitals  reviewed.       Assessment & Plan:     1. Paronychia of left middle finger Will give antibiotic as below. Advised to use warm compresses. Call if symptoms do not improve or if they worsen. - cephALEXin (KEFLEX) 500 MG capsule; Take 1 capsule (500 mg total) by mouth 3 (three) times daily.  Dispense: 21 capsule; Refill: 0  2. Essential hypertension Stable. Diagnosis pulled for medication refill. Continue current medical treatment plan. - amLODipine-benazepril (LOTREL) 5-40 MG capsule; Take 1 capsule by mouth daily.  Dispense: 30 capsule; Refill: [place], PA-C  [facility]

## 2017-05-26 ENCOUNTER — Ambulatory Visit: Payer: BC Managed Care – PPO | Admitting: Family Medicine

## 2017-05-26 ENCOUNTER — Encounter: Payer: Self-pay | Admitting: Family Medicine

## 2017-05-26 VITALS — BP 144/90 | HR 82 | Temp 98.5°F | Resp 18 | Wt 212.0 lb

## 2017-05-26 DIAGNOSIS — Z23 Encounter for immunization: Secondary | ICD-10-CM

## 2017-05-26 DIAGNOSIS — J0100 Acute maxillary sinusitis, unspecified: Secondary | ICD-10-CM | POA: Diagnosis not present

## 2017-05-26 MED ORDER — AMOXICILLIN 500 MG PO CAPS: 1000.0000 mg | ORAL_CAPSULE | Freq: Two times a day (BID) | ORAL | 0 refills | Status: AC

## 2017-05-26 NOTE — Addendum Note (Signed)
Addended by: Randal Buba on: 05/26/2017 09:46 AM   Modules accepted: Orders

## 2017-05-26 NOTE — Progress Notes (Signed)
Patient: Nicole Tyler Female    DOB: 31-Mar-1956   61 y.o.   MRN: [id number] Visit Date: 05/26/2017  Today's Provider: Lelon Huh, MD   Chief Complaint  Patient presents with  . Cough    x 3 days   Subjective:    Cough  This is a new problem. Episode onset: 3 days ago. The problem has been gradually worsening. The cough is productive of sputum (thick yellow colored). Associated symptoms include chills, headaches, myalgias (in shoulder and neck), nasal congestion, a sore throat, sweats and wheezing. Pertinent negatives include no chest pain, ear congestion, ear pain, fever, hemoptysis, postnasal drip, rhinorrhea or shortness of breath. Treatments tried: Allegra D. The treatment provided no relief.  Has also been having a lot of frontal sinus congestion. No sore throat. No dyspnea.    No Known Allergies   Current Outpatient Medications:  .  amLODipine-benazepril (LOTREL) 5-40 MG capsule, Take 1 capsule by mouth daily., Disp: 30 capsule, Rfl: 11 .  cephALEXin (KEFLEX) 500 MG capsule, Take 1 capsule (500 mg total) by mouth 3 (three) times daily., Disp: 21 capsule, Rfl: 0 .  gabapentin (NEURONTIN) 100 MG capsule, Take 1 capsule at bedtime for 4 days, then 2 capsules at bedtime for 4 days, then 3 capsules at bedtime. (Patient not taking: Reported on 02/05/2017), Disp: 90 capsule, Rfl: 1  Review of Systems  Constitutional: Positive for chills, diaphoresis and fatigue. Negative for appetite change and fever.  HENT: Positive for congestion, sinus pressure, sinus pain, sneezing, sore throat and voice change. Negative for ear pain, mouth sores, nosebleeds, postnasal drip, rhinorrhea and tinnitus.   Respiratory: Positive for cough and wheezing. Negative for hemoptysis, chest tightness and shortness of breath.   Cardiovascular: Negative for chest pain and palpitations.  Gastrointestinal: Negative for abdominal pain, nausea and vomiting.  Musculoskeletal: Positive for myalgias (in  shoulder and neck).  Neurological: Positive for headaches. Negative for dizziness and weakness.    Social History   Tobacco Use  . Smoking status: Never Smoker  . Smokeless tobacco: Never Used  Substance Use Topics  . Alcohol use: No    Alcohol/week: 0.0 oz   Objective:   BP (!) 144/90 (BP Location: Right Arm, Patient Position: Sitting, Cuff Size: Large)   Pulse 82   Temp 98.5 F (36.9 C) (Oral)   Resp 18   Wt 212 lb (96.2 kg)   SpO2 97% Comment: room air  BMI 36.39 kg/m  There were no vitals filed for this visit.   Physical Exam  General Appearance:    Alert, cooperative, no distress  HENT:   ENT exam normal, no neck nodes or sinus tenderness, bilateral TM normal without fluid or infection, neck without nodes, throat normal without erythema or exudate, frontal and maxillary sinuses tender and nasal mucosa pale and congested  Eyes:    PERRL, conjunctiva/corneas clear, EOM's intact       Lungs:     Clear to auscultation bilaterally, respirations unlabored  Heart:    Regular rate and rhythm  Neurologic:   Awake, alert, oriented x 3. No apparent focal neurological           defect.           Assessment & Plan:     1. Acute maxillary sinusitis, recurrence not specified  - amoxicillin (AMOXIL) 500 MG capsule; Take 2 capsules (1,000 mg total) by mouth 2 (two) times daily for 10 days.  Dispense: 40 capsule; Refill: 0  Lelon Huh, MD  [facility]

## 2017-06-07 IMAGING — CR DG LUMBAR SPINE COMPLETE 4+V
1 series · 5 of 5 positions shown · non-contrast
Comparison: None

CLINICAL DATA: Lower abdominal pain in groin area more on LEFT for
2-3 weeks, low back pain beginning this weekend, back pain radiating
to pelvis, no known injury, history hypertension

EXAM:
LUMBAR SPINE - COMPLETE 4+ VIEW

[Series 1: dg lumbar spine complete 4 +v · 0.14mm/px · 5 of 5 slices shown]
[im 1/5]
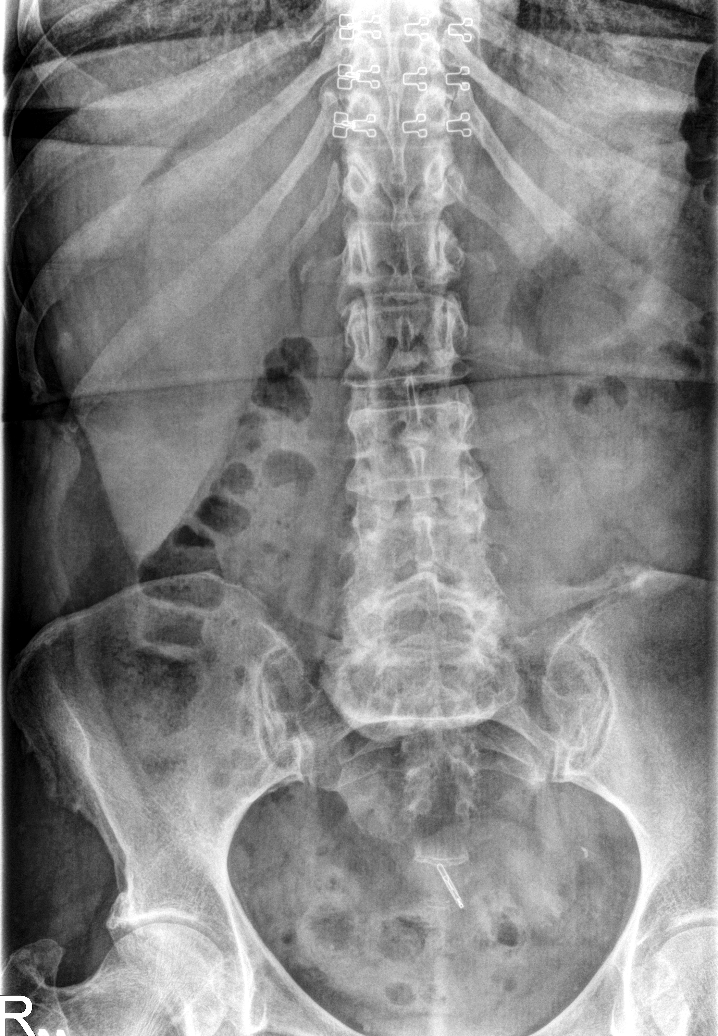
[im 2/5]
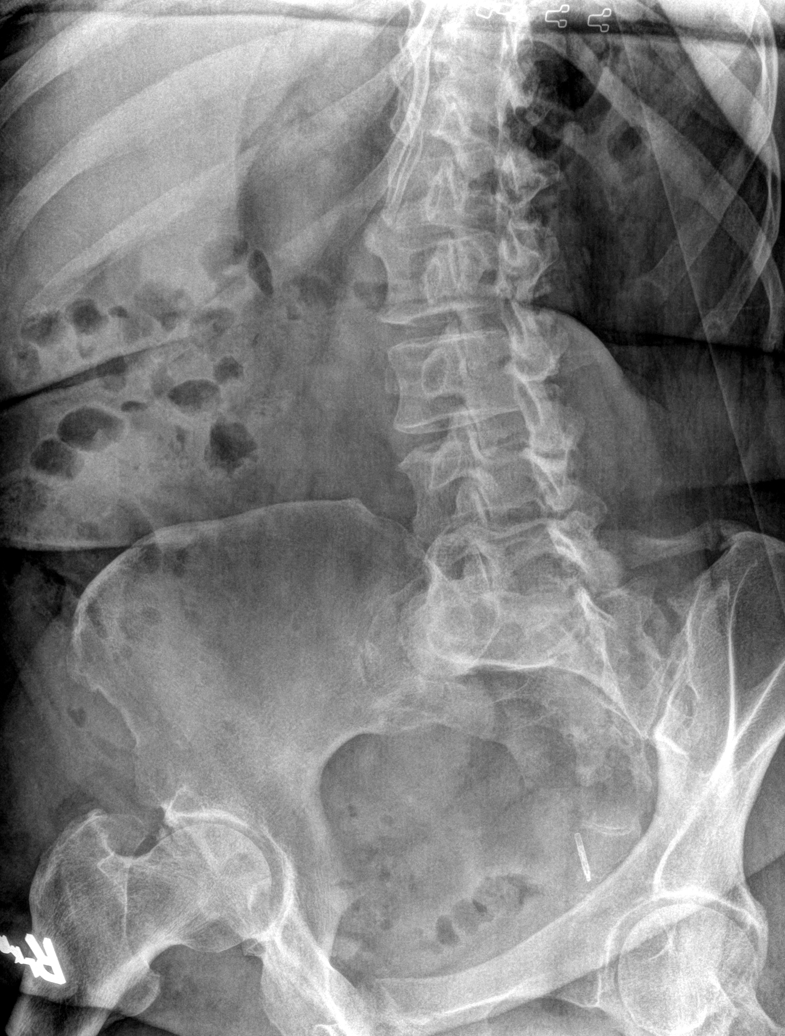
[im 3/5]
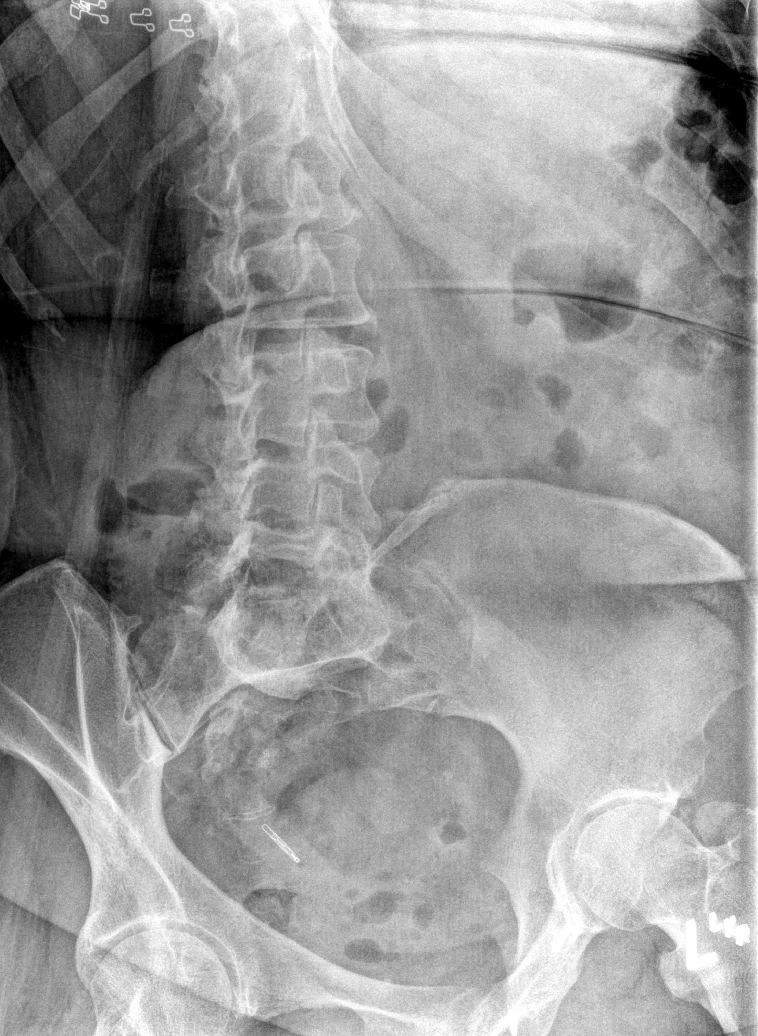
[im 4/5]
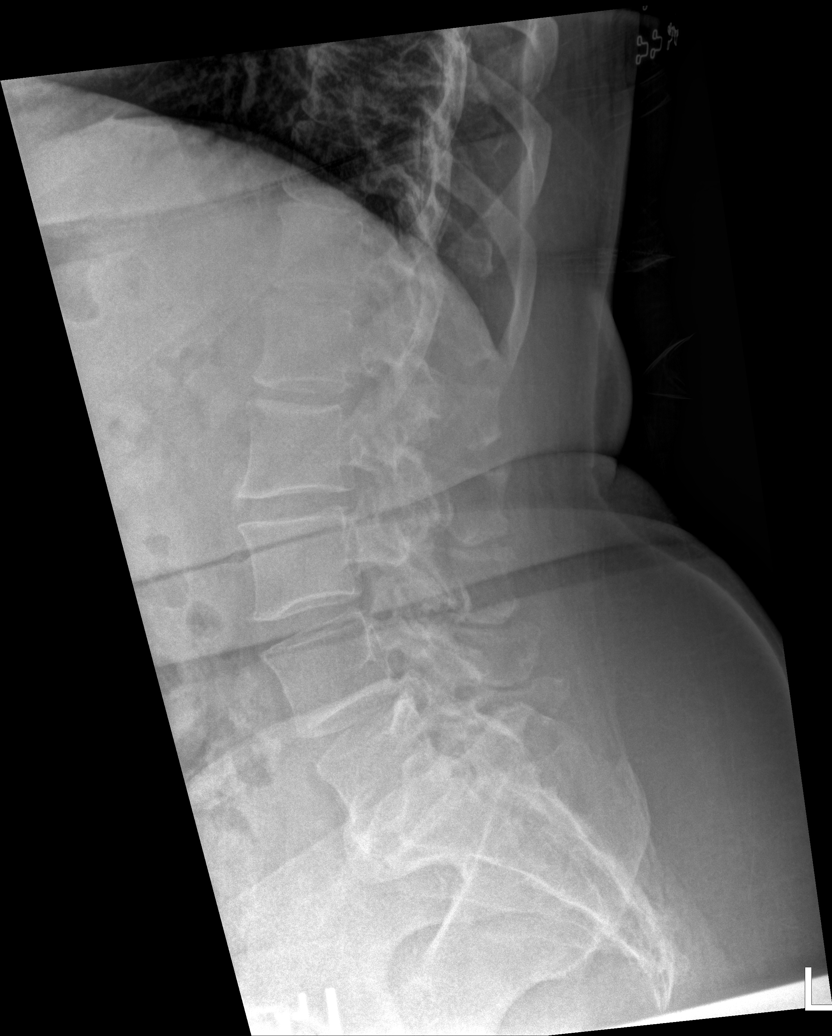
[im 5/5]
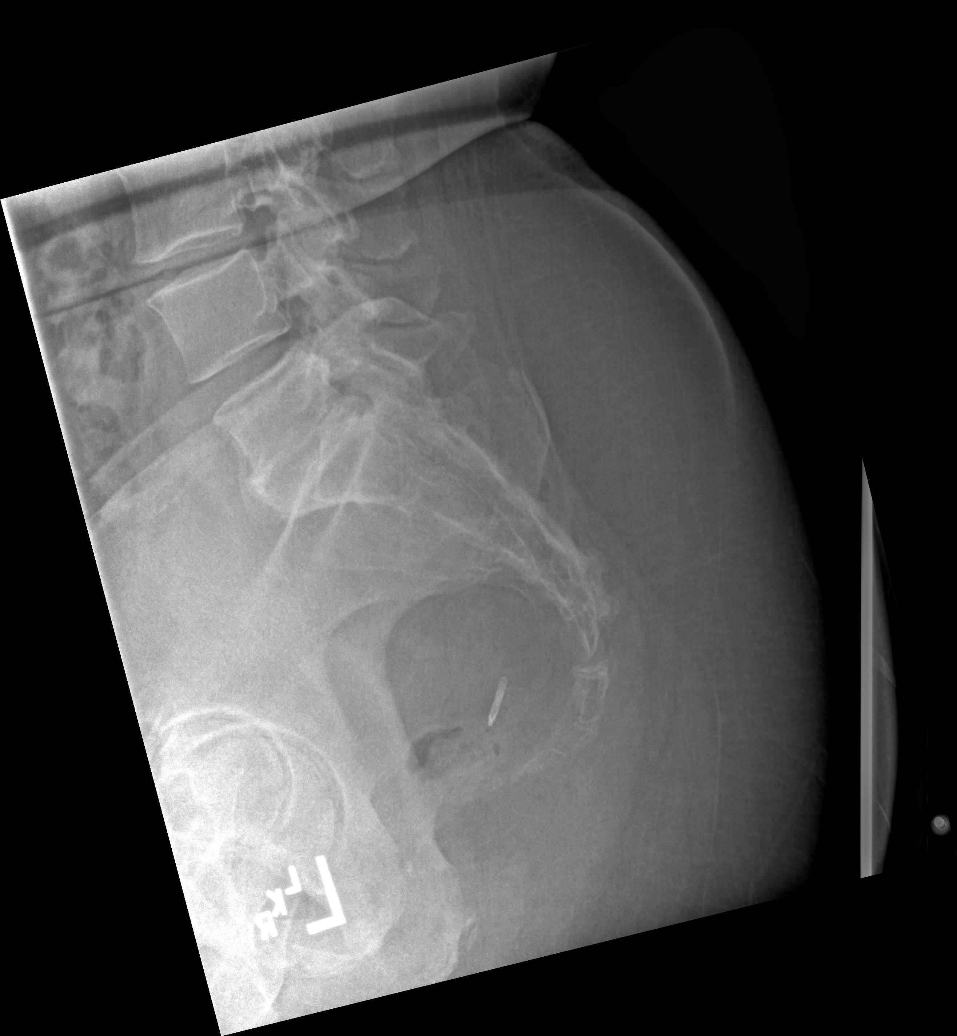

[5 of 5 positions shown; findings below may reference images not displayed]

FINDINGS: Five non-rib-bearing lumbar vertebra.

Facet degenerative changes lower lumbar spine.

Disc space narrowing and endplate spur formation L5-S1.

Question bony neural foraminal stenosis L5-S1.

Vertebral body heights maintained without fracture or subluxation.

No spondylolysis.

Surgical clip in pelvis.

SI joints preserved.
IMPRESSION: Degenerative disc disease changes L5-S1, cannot exclude bony
foraminal stenosis due to spur formation.

Facet degenerative changes lower lumbar spine.

## 2018-08-27 ENCOUNTER — Ambulatory Visit (INDEPENDENT_AMBULATORY_CARE_PROVIDER_SITE_OTHER): Payer: BC Managed Care – PPO | Admitting: Family Medicine

## 2018-08-27 ENCOUNTER — Other Ambulatory Visit: Payer: Self-pay

## 2018-08-27 ENCOUNTER — Encounter: Payer: Self-pay | Admitting: Family Medicine

## 2018-08-27 VITALS — BP 148/86 | HR 72 | Temp 98.8°F | Resp 16 | Ht 63.0 in | Wt 220.0 lb

## 2018-08-27 DIAGNOSIS — I10 Essential (primary) hypertension: Secondary | ICD-10-CM

## 2018-08-27 MED ORDER — AMLODIPINE BESY-BENAZEPRIL HCL 5-20 MG PO CAPS: 1.0000 | ORAL_CAPSULE | Freq: Every day | ORAL | 5 refills | Status: DC

## 2018-08-27 NOTE — Progress Notes (Signed)
       Patient: Nicole Tyler Female    DOB: 09-01-1955   63 y.o.   MRN: [id number] Visit Date: 08/27/2018  Today's Provider: Lelon Huh, MD   Chief Complaint  Patient presents with  . Hypertension   Subjective:     HPI  Hypertension, follow-up:  BP Readings from Last 3 Encounters:  08/27/18 (!) 148/86  05/26/17 (!) 144/90  02/05/17 (!) 160/100    She was last seen for hypertension 2 years ago.  BP at that visit was 144/90. Management since that visit includes no changes. She reports good compliance with treatment. She is not having side effects.  She is not exercising. She is adherent to low salt diet.   Outside blood pressures are not being checked. She is experiencing none.  Patient denies exertional chest pressure/discomfort, lower extremity edema and palpitations.    Weight trend: stable Wt Readings from Last 3 Encounters:  08/27/18 220 lb (99.8 kg)  05/26/17 212 lb (96.2 kg)  02/05/17 212 lb 9.6 oz (96.4 kg)    Current diet: well balanced  No Known Allergies   Current Outpatient Medications:  .  amLODipine-benazepril (LOTREL) 5-40 MG capsule, Take 1 capsule by mouth daily., Disp: 30 capsule, Rfl: 11  Review of Systems  Constitutional: Negative for activity change, appetite change, chills, diaphoresis, fatigue, fever and unexpected weight change.  Respiratory: Negative for cough and shortness of breath.   Cardiovascular: Negative for chest pain, palpitations and leg swelling.  Musculoskeletal: Positive for arthralgias and myalgias.  Neurological: Negative for dizziness, weakness, light-headedness and headaches.    Social History   Tobacco Use  . Smoking status: Never Smoker  . Smokeless tobacco: Never Used  Substance Use Topics  . Alcohol use: No    Alcohol/week: 0.0 standard drinks      Objective:   BP (!) 148/86 (BP Location: Right Arm, Patient Position: Sitting, Cuff Size: Large)   Pulse 72   Temp 98.8 F (37.1 C)   Resp 16   Ht  5\' 3"  (1.6 m)   Wt 220 lb (99.8 kg)   SpO2 98%   BMI 38.97 kg/m  Vitals:   08/27/18 1342  BP: (!) 148/86  Pulse: 72  Resp: 16  Temp: 98.8 F (37.1 C)  SpO2: 98%  Weight: 220 lb (99.8 kg)  Height: 5\' 3"  (1.6 m)     Physical Exam  General Appearance:    Alert, cooperative, no distress, obese  Eyes:    PERRL, conjunctiva/corneas clear, EOM's intact       Lungs:     Clear to auscultation bilaterally, respirations unlabored  Heart:    Regular rate and rhythm  Neurologic:   Awake, alert, oriented x 3. No apparent focal neurological           defect.          Assessment & Plan    1. Essential hypertension Currently out of Lotrel 5-40. Will restart at lower dose and she is to check labs in two weeks.  - amLODipine-benazepril (LOTREL) 5-20 MG capsule; Take 1 capsule by mouth daily.  Dispense: 30 capsule; Refill: 5 - Renal function panel - Lipid panel     Lelon Huh, MD  [facility]

## 2018-08-27 NOTE — Patient Instructions (Signed)
.   Please review the attached list of medications and notify my office if there are any errors.   . Please bring all of your medications to every appointment so we can make sure that our medication list is the same as yours.   . Please go to the lab draw station in [address] on the second floor of [facility] when you are fasting sometime during the first week of April. Normal hours are 8:00am to 12:30pm and 1:30pm to 4:00pm Monday through Friday

## 2018-09-11 LAB — RENAL FUNCTION PANEL
ALBUMIN: 4.2 g/dL (ref 3.8–4.8)
BUN / CREAT RATIO: 11 — AB (ref 12–28)
BUN: 9 mg/dL (ref 8–27)
CALCIUM: 9.5 mg/dL (ref 8.7–10.3)
CO2: 26 mmol/L (ref 20–29)
CREATININE: 0.85 mg/dL (ref 0.57–1.00)
Chloride: 104 mmol/L (ref 96–106)
GFR calc Af Amer: 84 mL/min/{1.73_m2} (ref >59)
GFR calc non Af Amer: 73 mL/min/{1.73_m2} (ref >59)
Glucose: 91 mg/dL (ref 65–99)
PHOSPHORUS: 3.1 mg/dL (ref 3.0–4.3)
Potassium: 4.5 mmol/L (ref 3.5–5.2)
Sodium: 145 mmol/L — ABNORMAL HIGH (ref 134–144)

## 2018-09-11 LAB — LIPID PANEL
CHOLESTEROL TOTAL: 183 mg/dL (ref 100–199)
Chol/HDL Ratio: 3.6 ratio (ref 0.0–4.4)
HDL: 51 mg/dL (ref >39)
LDL CALC: 113 mg/dL — AB (ref 0–99)
TRIGLYCERIDES: 95 mg/dL (ref 0–149)
VLDL CHOLESTEROL CAL: 19 mg/dL (ref 5–40)

## 2019-03-14 ENCOUNTER — Other Ambulatory Visit: Payer: Self-pay

## 2019-03-14 ENCOUNTER — Ambulatory Visit (INDEPENDENT_AMBULATORY_CARE_PROVIDER_SITE_OTHER): Payer: BLUE CROSS/BLUE SHIELD | Admitting: Family Medicine

## 2019-03-14 DIAGNOSIS — Z23 Encounter for immunization: Secondary | ICD-10-CM

## 2019-03-30 ENCOUNTER — Encounter: Payer: Self-pay | Admitting: Physician Assistant

## 2019-03-30 NOTE — Progress Notes (Signed)
Patient: Nicole Tyler Female    DOB: 1955/11/09   63 y.o.   MRN: [id number] Visit Date: 03/30/2019  Today's Provider: Trinna Post, PA-C   Chief Complaint  Patient presents with  . URI   Subjective:     Virtual Visit via Video Note  I connected with Nicole Tyler on 03/30/19 at  8:00 AM EDT by a video enabled telemedicine application and verified that I am speaking with the correct person using two identifiers.  Location: Patient: Home Provider: Office    Interactive audio and video communications were attempted, although failed due to patient's inability to connect to video. Continued visit with audio only interaction with patient agreement.  I discussed the assessment and treatment plan with the patient. The patient was provided an opportunity to ask questions and all were answered. The patient agreed with the plan and demonstrated an understanding of the instructions.   The patient was advised to call back or seek an in-person evaluation if the symptoms worsen or if the condition fails to improve as anticipated.    URI  This is a new problem. Associated symptoms include congestion (sinus congestion), coughing (hacking productive cough), a plugged ear sensation (right ear), sinus pain and wheezing (improved). Pertinent negatives include no abdominal pain, chest pain, ear pain, nausea, sneezing, sore throat or vomiting.   Reports she had a flu shot on 03/14/2019 and then the following weekend she had some congestion and coughing. Reports her ears report feel better. Alka seltzer plus cold and cough. No facial pain or pressure. No fevers. Temperature today is 97.27F.   Patient reports she was doing some yard work and she things something bit her or touched her. She reports a portion of her arm has swollen into a big egg. This happened Tuesday evening and Wednesday it felt warm. Reports a pimple has appeared in the center now.   No Known Allergies   Current  Outpatient Medications:  .  amLODipine-benazepril (LOTREL) 5-20 MG capsule, Take 1 capsule by mouth daily., Disp: 30 capsule, Rfl: 5  Review of Systems  Constitutional: Negative for appetite change, chills, fatigue and fever.  HENT: Positive for congestion (sinus congestion), sinus pressure and sinus pain. Negative for ear pain, sneezing, sore throat and trouble swallowing.        Right ear stopped up  Respiratory: Positive for cough (hacking productive cough) and wheezing (improved). Negative for chest tightness and shortness of breath.   Cardiovascular: Negative for chest pain and palpitations.  Gastrointestinal: Negative for abdominal pain, nausea and vomiting.  Musculoskeletal: Positive for myalgias (swelling of the right arm due to possible insect bite).  Neurological: Negative for dizziness and weakness.    Social History   Tobacco Use  . Smoking status: Never Smoker  . Smokeless tobacco: Never Used  Substance Use Topics  . Alcohol use: No    Alcohol/week: 0.0 standard drinks      Objective:   There were no vitals taken for this visit. There were no vitals filed for this visit.There is no height or weight on file to calculate BMI.   Physical Exam   No results found for any visits on 03/31/19.     Assessment & Plan    1. Upper respiratory tract infection, unspecified type  Xyzal QHS and also flonase 2 sprays each nostril daily for ear pain and fullness.  2. Skin lesion  Could be a bite, may be an abscess. I have asked patient  to send picture of lesion through [facility] so I Can better assess.   The entirety of the information documented in the History of Present Illness, Review of Systems and Physical Exam were personally obtained by me. Portions of this information were initially documented by Meyer Cory, CMA and reviewed by me for thoroughness and accuracy.      Trinna Post, PA-C  [facility]

## 2019-03-31 ENCOUNTER — Telehealth (INDEPENDENT_AMBULATORY_CARE_PROVIDER_SITE_OTHER): Payer: BLUE CROSS/BLUE SHIELD | Admitting: Physician Assistant

## 2019-03-31 VITALS — Temp 97.4°F

## 2019-03-31 DIAGNOSIS — J069 Acute upper respiratory infection, unspecified: Secondary | ICD-10-CM | POA: Diagnosis not present

## 2019-03-31 DIAGNOSIS — L989 Disorder of the skin and subcutaneous tissue, unspecified: Secondary | ICD-10-CM | POA: Diagnosis not present

## 2019-04-04 ENCOUNTER — Other Ambulatory Visit: Payer: Self-pay | Admitting: Family Medicine

## 2019-04-04 DIAGNOSIS — I10 Essential (primary) hypertension: Secondary | ICD-10-CM

## 2019-11-29 ENCOUNTER — Other Ambulatory Visit: Payer: Self-pay | Admitting: Family Medicine

## 2019-11-29 DIAGNOSIS — I10 Essential (primary) hypertension: Secondary | ICD-10-CM

## 2019-11-29 NOTE — Telephone Encounter (Signed)
Requested medications are due for refill today?  Yes  Requested medications are on active medication list?  Yes  Last Refill:   04/04/2019  # 30 with 5 refills   Future visit scheduled?  No   Notes to Clinic:  Medication failed RX refill protocol due to no valid encounter in the past 6 months and no labs in the past 180 days.  Last labs were performed on 09/10/2018.

## 2019-12-27 NOTE — Progress Notes (Signed)
Established patient visit   Patient: Nicole Tyler   DOB: [birth year]   64 y.o. Female  MRN: [id number] Visit Date: 12/28/2019  Today's healthcare provider: Trinna Post, PA-C   Chief Complaint  Patient presents with  . Rash  . Hip Pain  I,Lain Tetterton M Haylen Bellotti,acting as a scribe for Trinna Post, PA-C.,have documented all relevant documentation on the behalf of Trinna Post, PA-C,as directed by  Trinna Post, PA-C while in the presence of Trinna Post, PA-C.  Subjective    Hip Pain  The incident occurred more than 1 week ago. There was no injury mechanism. The pain is present in the left hip, left leg and left knee. The quality of the pain is described as shooting. The pain is at a severity of 8/10. The pain is moderate. Associated symptoms include numbness and tingling (left toes per pt). Pertinent negatives include no loss of motion, loss of sensation or muscle weakness. She reports no foreign bodies present. The symptoms are aggravated by movement and weight bearing. She has tried acetaminophen, ice, heat and NSAIDs for the symptoms. The treatment provided mild relief.  Rash This is a new problem. The current episode started in the past 7 days. The problem is unchanged. The affected locations include the scalp, head, back and neck. The rash is characterized by itchiness and redness. She was exposed to nothing. Pertinent negatives include no congestion, diarrhea, fatigue or shortness of breath. Past treatments include nothing. The treatment provided no relief.    Patient had a lumbar MRI in 2017 that showed:  IMPRESSION: 1. Bilateral moderate neural foraminal stenosis at L5-S1 due to disc degeneration and endplate osteophytosis. 2. Small central disc protrusion and annular fissure at L5-S1, narrowing the right lateral recess. Correlate for symptoms of right S1 radiculopathy. 3. Right-sided far lateral osteophyte is in close proximity to the exiting right L5 nerve  root (series 6 image 28). 4. Small central disc protrusion at L4-L5 with annular fissure. No spinal canal or neural foraminal stenosis.  She reports having left face numbness two days ago that resolved. She did not seek emergency treatment.   Patient also reports itchy rash on scalp.      Medications: Outpatient Medications Prior to Visit  Medication Sig  . [DISCONTINUED] amLODipine-benazepril (LOTREL) 5-20 MG capsule TAKE 1 CAPSULE BY MOUTH EVERY DAY   No facility-administered medications prior to visit.    Review of Systems  Constitutional: Negative.  Negative for fatigue.  HENT: Negative for congestion.   Respiratory: Negative for shortness of breath.   Cardiovascular: Positive for leg swelling.  Gastrointestinal: Negative for diarrhea.  Skin: Positive for rash.  Neurological: Positive for tingling (left toes per pt) and numbness.  Hematological: Negative.       Objective    BP (!) 148/92 (BP Location: Left Arm, Patient Position: Sitting, Cuff Size: Normal)   Pulse 72   Temp (!) 97.1 F (36.2 C) (Temporal)   Wt 217 lb (98.4 kg)   SpO2 98%   BMI 38.44 kg/m    Physical Exam Constitutional:      Appearance: Normal appearance.  Eyes:     Extraocular Movements: Extraocular movements intact.     Pupils: Pupils are equal, round, and reactive to light.  Cardiovascular:     Rate and Rhythm: Normal rate and regular rhythm.     Heart sounds: Normal heart sounds.  Pulmonary:     Effort: Pulmonary effort is normal. No respiratory distress.  Skin:    General: Skin is warm and dry.  Neurological:     General: No focal deficit present.     Mental Status: She is alert and oriented to person, place, and time.     Cranial Nerves: No cranial nerve deficit.     Sensory: No sensory deficit.     Motor: No weakness.     Coordination: Coordination normal.     Gait: Gait normal.  Psychiatric:        Mood and Affect: Mood normal.        Behavior: Behavior normal.        No results found for any visits on 12/28/19.  Assessment & Plan    1. Hip pain  Consistent with lumbar radiculopathy. Counseled on stepwise approach to tx including muscle relaxers, anti-inflammatories, PT, gabapentin, lyrica, ESI and ultimately surgery. Return precautions counseled.  - predniSONE (DELTASONE) 10 MG tablet; Take taper dose:take 6 on day 1, 5 on day 2, 4 on day 3, etc..  Dispense: 21 tablet; Refill: 0 - cyclobenzaprine (FLEXERIL) 5 MG tablet; Take 1 tablet (5 mg total) by mouth 3 (three) times daily as needed for muscle spasms.  Dispense: 30 tablet; Refill: 0  2. Rash  - Clobetasol Propionate 0.05 % shampoo; Use once or twice daily  Dispense: 118 mL; Refill: 0  3. Essential (primary) hypertension Patient bp was elevated during office visit today and patient was advised to increase medication as below. Patient advised I cannot rule out stroke, offered CT scan, she declined. Discussed controlling risk factors for stroke including HTN and HLD. Increase as below, f/u w/ PCP 1 month.  - amLODipine-benazepril (LOTREL) 10-40 MG capsule; Take 1 capsule by mouth daily.  Dispense: 90 capsule; Refill: 1   Return in about 1 month (around 01/28/2020).      ITrinna Post, PA-C, have reviewed all documentation for this visit. The documentation on 12/29/19 for the exam, diagnosis, procedures, and orders are all accurate and complete.    Paulene Floor  [facility] [phone] (phone) [phone] (fax)  [place]

## 2019-12-28 ENCOUNTER — Ambulatory Visit: Payer: 59 | Admitting: Physician Assistant

## 2019-12-28 ENCOUNTER — Other Ambulatory Visit: Payer: Self-pay

## 2019-12-28 ENCOUNTER — Encounter: Payer: Self-pay | Admitting: Physician Assistant

## 2019-12-28 VITALS — BP 148/92 | HR 72 | Temp 97.1°F | Wt 217.0 lb

## 2019-12-28 DIAGNOSIS — R21 Rash and other nonspecific skin eruption: Secondary | ICD-10-CM | POA: Diagnosis not present

## 2019-12-28 DIAGNOSIS — M25559 Pain in unspecified hip: Secondary | ICD-10-CM

## 2019-12-28 DIAGNOSIS — I10 Essential (primary) hypertension: Secondary | ICD-10-CM

## 2019-12-28 MED ORDER — CYCLOBENZAPRINE HCL 5 MG PO TABS: 5.0000 mg | ORAL_TABLET | Freq: Three times a day (TID) | ORAL | 0 refills | Status: DC | PRN

## 2019-12-28 MED ORDER — PREDNISONE 10 MG PO TABS: ORAL_TABLET | ORAL | 0 refills | Status: DC

## 2019-12-28 MED ORDER — CLOBETASOL PROPIONATE 0.05 % EX SHAM: MEDICATED_SHAMPOO | CUTANEOUS | 0 refills | Status: DC

## 2019-12-28 MED ORDER — AMLODIPINE BESY-BENAZEPRIL HCL 10-40 MG PO CAPS: 1.0000 | ORAL_CAPSULE | Freq: Every day | ORAL | 1 refills | Status: DC

## 2019-12-28 NOTE — Patient Instructions (Signed)
Hip Pain The hip is the joint between the upper legs and the lower pelvis. The bones, cartilage, tendons, and muscles of your hip joint support your body and allow you to move around. Hip pain can range from a minor ache to severe pain in one or both of your hips. The pain may be felt on the inside of the hip joint near the groin, or on the outside near the buttocks and upper thigh. You may also have swelling or stiffness in your hip area. Follow these instructions at home: Managing pain, stiffness, and swelling      If directed, put ice on the painful area. To do this: ? Put ice in a plastic bag. ? Place a towel between your skin and the bag. ? Leave the ice on for 20 minutes, 2-3 times a day.  If directed, apply heat to the affected area as often as told by your health care provider. Use the heat source that your health care provider recommends, such as a moist heat pack or a heating pad. ? Place a towel between your skin and the heat source. ? Leave the heat on for 20-30 minutes. ? Remove the heat if your skin turns bright red. This is especially important if you are unable to feel pain, heat, or cold. You may have a greater risk of getting burned. Activity  Do exercises as told by your health care provider.  Avoid activities that cause pain. General instructions   Take over-the-counter and prescription medicines only as told by your health care provider.  Keep a journal of your symptoms. Write down: ? How often you have hip pain. ? The location of your pain. ? What the pain feels like. ? What makes the pain worse.  Sleep with a pillow between your legs on your most comfortable side.  Keep all follow-up visits as told by your health care provider. This is important. Contact a health care provider if:  You cannot put weight on your leg.  Your pain or swelling continues or gets worse after one week.  It gets harder to walk.  You have a fever. Get help right away  if:  You fall.  You have a sudden increase in pain and swelling in your hip.  Your hip is red or swollen or very tender to touch. Summary  Hip pain can range from a minor ache to severe pain in one or both of your hips.  The pain may be felt on the inside of the hip joint near the groin, or on the outside near the buttocks and upper thigh.  Avoid activities that cause pain.  Write down how often you have hip pain, the location of the pain, what makes it worse, and what it feels like. This information is not intended to replace advice given to you by your health care provider. Make sure you discuss any questions you have with your health care provider. Document Revised: 10/11/2018 Document Reviewed: 10/11/2018 Elsevier Patient Education  2020 [employer] -- 

## 2020-01-30 ENCOUNTER — Ambulatory Visit (INDEPENDENT_AMBULATORY_CARE_PROVIDER_SITE_OTHER): Payer: 59 | Admitting: Family Medicine

## 2020-01-30 ENCOUNTER — Other Ambulatory Visit: Payer: Self-pay

## 2020-01-30 ENCOUNTER — Encounter: Payer: Self-pay | Admitting: Family Medicine

## 2020-01-30 VITALS — BP 129/75 | HR 80 | Temp 98.6°F | Resp 16 | Wt 219.6 lb

## 2020-01-30 DIAGNOSIS — I10 Essential (primary) hypertension: Secondary | ICD-10-CM | POA: Diagnosis not present

## 2020-01-30 DIAGNOSIS — L989 Disorder of the skin and subcutaneous tissue, unspecified: Secondary | ICD-10-CM | POA: Diagnosis not present

## 2020-01-30 DIAGNOSIS — M51369 Other intervertebral disc degeneration, lumbar region without mention of lumbar back pain or lower extremity pain: Secondary | ICD-10-CM

## 2020-01-30 DIAGNOSIS — M25559 Pain in unspecified hip: Secondary | ICD-10-CM

## 2020-01-30 DIAGNOSIS — M5136 Other intervertebral disc degeneration, lumbar region: Secondary | ICD-10-CM | POA: Diagnosis not present

## 2020-01-30 MED ORDER — MELOXICAM 15 MG PO TABS: 15.0000 mg | ORAL_TABLET | Freq: Every day | ORAL | 1 refills | Status: DC | PRN

## 2020-01-30 NOTE — Progress Notes (Signed)
Established patient visit   Patient: Nicole Tyler   DOB: 1956-04-06   64 y.o. Female  MRN: [id number] Visit Date: 01/30/2020  Today's healthcare provider: Lelon Huh, MD   Chief Complaint  Patient presents with  . Hypertension  . Hip Pain    Follow Up   Subjective    Rash This is a new problem. The current episode started more than 1 month ago. The problem is unchanged. The affected locations include the chest and back. The rash is characterized by itchiness. She was exposed to nothing. Pertinent negatives include no anorexia, congestion, cough, diarrhea, eye pain, facial edema, fatigue, fever, joint pain, nail changes, rhinorrhea, shortness of breath, sore throat or vomiting. Past treatments include anti-itch cream. The treatment provided no relief.  Seen Fabio Bering for this in July, rash consists of widely scattered isolated circular scab like lesions slightly red and about 42mm in diameter. She was prescribed clobetasol shampoo for lesions on scalp and has been using OTc hydrocortisone for lesions on trunk and extremities which helps with itching. Seems to be worse with heat. Not painful and not burning at all.    Hypertension, follow-up  BP Readings from Last 3 Encounters:  01/30/20 129/75  12/28/19 (!) 148/92  08/27/18 (!) 148/86   Wt Readings from Last 3 Encounters:  01/30/20 219 lb 9.6 oz (99.6 kg)  12/28/19 217 lb (98.4 kg)  08/27/18 220 lb (99.8 kg)     She was last seen for hypertension 1 months ago.  BP at that visit was 148/92. Management since that visit includes patient advised to increase Amlodipine to 10 mg.  She reports good compliance with treatment. She is not having side effects.  She is following a Regular diet. She is exercising. She does not smoke.  Use of agents associated with hypertension: none.   Outside blood pressures are <140/90. Symptoms: No chest pain No chest pressure  No palpitations No syncope  No dyspnea No orthopnea  No  paroxysmal nocturnal dyspnea No lower extremity edema   Pertinent labs: Lab Results  Component Value Date   CHOL 183 09/10/2018   HDL 51 09/10/2018   LDLCALC 113 (H) 09/10/2018   TRIG 95 09/10/2018   CHOLHDL 3.6 09/10/2018   Lab Results  Component Value Date   NA 145 (H) 09/10/2018   K 4.5 09/10/2018   CREATININE 0.85 09/10/2018   GFRNONAA 73 09/10/2018   GFRAA 84 09/10/2018   GLUCOSE 91 09/10/2018     The 10-year ASCVD risk score Mikey Bussing DC Jr., et al., 2013) is: 8.3%   --------------------------------------------------------------------------------------------------- Follow up for Hip and back pain:  The patient was last seen for this 1 months ago. Changes made at last visit include started Prednisone taper and Flexeril.  She reports good compliance with treatment. She feels that condition is Worse. Difficulty doing daily activities patient reports pain in hips and lower back, she states pain is worse at night.  She is not having side effects.    LS spine 2017 IMPRESSION: 1. Bilateral moderate neural foraminal stenosis at L5-S1 due to disc degeneration and endplate osteophytosis. 2. Small central disc protrusion and annular fissure at L5-S1, narrowing the right lateral recess. Correlate for symptoms of right S1 radiculopathy. 3. Right-sided far lateral osteophyte is in close proximity to the exiting right L5 nerve root (series 6 image 28). 4. Small central disc protrusion at L4-L5 with annular fissure. No spinal canal or neural foraminal stenosis. --------------------------------------------------------------------------------------   No Known Allergies  Medications: Outpatient Medications Prior to Visit  Medication Sig  . amLODipine-benazepril (LOTREL) 10-40 MG capsule Take 1 capsule by mouth daily.  . Clobetasol Propionate 0.05 % shampoo Use once or twice daily  . [DISCONTINUED] cyclobenzaprine (FLEXERIL) 5 MG tablet Take 1 tablet (5 mg total) by mouth 3  (three) times daily as needed for muscle spasms.  . [DISCONTINUED] predniSONE (DELTASONE) 10 MG tablet Take taper dose:take 6 on day 1, 5 on day 2, 4 on day 3, etc..   No facility-administered medications prior to visit.    Review of Systems  Constitutional: Negative.  Negative for fatigue and fever.  HENT: Negative for congestion, rhinorrhea and sore throat.   Eyes: Negative for pain.  Respiratory: Negative.  Negative for cough and shortness of breath.   Cardiovascular: Negative.   Gastrointestinal: Negative for anorexia, diarrhea and vomiting.  Musculoskeletal: Negative for joint pain.  Skin: Positive for rash. Negative for nail changes.      Objective    BP 129/75   Pulse 80   Temp 98.6 F (37 C) (Oral)   Resp 16   Wt 219 lb 9.6 oz (99.6 kg)   SpO2 95%   BMI 38.90 kg/m    Physical Exam   General: Appearance:    Obese female in no acute distress  Eyes:    PERRL, conjunctiva/corneas clear, EOM's intact       Lungs:     Clear to auscultation bilaterally, respirations unlabored  Heart:    Normal heart rate. Normal rhythm. No murmurs, rubs, or gallops.   MS:   All extremities are intact. Tender left of lumbar spine. No gross deformities.   Neurologic:   Awake, alert, oriented x 3. No apparent focal neurological           defect.        Assessment & Plan     1. Essential (primary) hypertension Improved with increased dosage of amlodipine which she is tolerating well.  - Comprehensive metabolic panel - Lipid panel - CBC  2. Hip pain   3. DDD (degenerative disc disease), lumbar try- meloxicam (MOBIC) 15 MG tablet; Take 1 tablet (15 mg total) by mouth daily as needed for pain.  Dispense: 30 tablet; Refill: 1  Recommended physical therapy. She is going to check with her insurance and get back to [place].   4. Skin lesions Scattered isolated, itchy, self limited lesions which improved with topical steroids. Likely exacerbated by heat. Expect these to continue to improve.  Consider dermatology referral if not.     Future Appointments  Date Time Provider [place]  08/03/2020 11:00 AM Caryn Section, Kirstie Peri, MD [facility]        The entirety of the information documented in the History of Present Illness, Review of Systems and Physical Exam were personally obtained by me. Portions of this information were initially documented by the CMA and reviewed by me for thoroughness and accuracy.      Lelon Huh, MD  [facility] [phone] (phone) [phone] (fax)  [place]

## 2020-01-30 NOTE — Patient Instructions (Signed)
.   Please review the attached list of medications and notify my office if there are any errors.   . Please bring all of your medications to every appointment so we can make sure that our medication list is the same as yours.    Physical therapy would probably help your back and pinched nerve quite a bit. Call my office at [phone] if you would like to go ahead and get referral to physical therapy.

## 2020-01-31 LAB — COMPREHENSIVE METABOLIC PANEL
ALT: 14 IU/L (ref 0–32)
AST: 21 IU/L (ref 0–40)
Albumin/Globulin Ratio: 1.4 (ref 1.2–2.2)
Albumin: 4.3 g/dL (ref 3.8–4.8)
Alkaline Phosphatase: 66 IU/L (ref 48–121)
BUN/Creatinine Ratio: 14 (ref 12–28)
BUN: 12 mg/dL (ref 8–27)
Bilirubin Total: 0.4 mg/dL (ref 0.0–1.2)
CO2: 25 mmol/L (ref 20–29)
Calcium: 9.4 mg/dL (ref 8.7–10.3)
Chloride: 103 mmol/L (ref 96–106)
Creatinine, Ser: 0.84 mg/dL (ref 0.57–1.00)
GFR calc Af Amer: 85 mL/min/{1.73_m2} (ref >59)
GFR calc non Af Amer: 74 mL/min/{1.73_m2} (ref >59)
Globulin, Total: 3 g/dL (ref 1.5–4.5)
Glucose: 87 mg/dL (ref 65–99)
Potassium: 3.8 mmol/L (ref 3.5–5.2)
Sodium: 141 mmol/L (ref 134–144)
Total Protein: 7.3 g/dL (ref 6.0–8.5)

## 2020-01-31 LAB — LIPID PANEL
Chol/HDL Ratio: 3.6 ratio (ref 0.0–4.4)
Cholesterol, Total: 201 mg/dL — ABNORMAL HIGH (ref 100–199)
HDL: 56 mg/dL (ref >39)
LDL Chol Calc (NIH): 130 mg/dL — ABNORMAL HIGH (ref 0–99)
Triglycerides: 81 mg/dL (ref 0–149)
VLDL Cholesterol Cal: 15 mg/dL (ref 5–40)

## 2020-01-31 LAB — CBC
Hematocrit: 42 % (ref 34.0–46.6)
Hemoglobin: 13.5 g/dL (ref 11.1–15.9)
MCH: 29.2 pg (ref 26.6–33.0)
MCHC: 32.1 g/dL (ref 31.5–35.7)
MCV: 91 fL (ref 79–97)
Platelets: 175 10*3/uL (ref 150–450)
RBC: 4.63 x10E6/uL (ref 3.77–5.28)
RDW: 13.9 % (ref 11.7–15.4)
WBC: 6.4 10*3/uL (ref 3.4–10.8)

## 2020-02-22 ENCOUNTER — Other Ambulatory Visit: Payer: Self-pay

## 2020-02-22 ENCOUNTER — Ambulatory Visit (INDEPENDENT_AMBULATORY_CARE_PROVIDER_SITE_OTHER): Payer: 59

## 2020-02-22 DIAGNOSIS — Z23 Encounter for immunization: Secondary | ICD-10-CM | POA: Diagnosis not present

## 2020-03-29 ENCOUNTER — Other Ambulatory Visit: Payer: Self-pay | Admitting: Family Medicine

## 2020-03-29 DIAGNOSIS — M5136 Other intervertebral disc degeneration, lumbar region: Secondary | ICD-10-CM

## 2020-03-29 NOTE — Telephone Encounter (Signed)
Requested medications are due for refill today yes  Requested medications are on the active medication list yes  Last refill 9/17  Last visit 01/2020  Future visit scheduled yes, 07/2020  Notes to clinic Rx only written for two months at visit two months ago, please assess if was to be continued.

## 2020-04-04 ENCOUNTER — Other Ambulatory Visit: Payer: Self-pay | Admitting: Family Medicine

## 2020-04-04 DIAGNOSIS — Z1231 Encounter for screening mammogram for malignant neoplasm of breast: Secondary | ICD-10-CM

## 2020-05-09 ENCOUNTER — Ambulatory Visit: Payer: 59 | Admitting: Family Medicine

## 2020-05-09 ENCOUNTER — Encounter: Payer: Self-pay | Admitting: Family Medicine

## 2020-05-09 ENCOUNTER — Other Ambulatory Visit: Payer: Self-pay

## 2020-05-09 VITALS — BP 140/88 | HR 80 | Temp 98.9°F | Resp 16 | Ht 63.0 in | Wt 221.0 lb

## 2020-05-09 DIAGNOSIS — B354 Tinea corporis: Secondary | ICD-10-CM

## 2020-05-09 MED ORDER — KETOCONAZOLE 2 % EX CREA: 1.0000 "application " | TOPICAL_CREAM | Freq: Every day | CUTANEOUS | 1 refills | Status: DC

## 2020-05-09 NOTE — Patient Instructions (Signed)
.   Please review the attached list of medications and notify my office if there are any errors.   Nicole Tyler get in touch with in about 2 weeks to see if improving. If so we sill get a prescription for ketoconazole shampoo, if not we will set you up to see a dermatologist.

## 2020-05-09 NOTE — Progress Notes (Signed)
Established patient visit   Patient: Nicole Tyler   DOB: March 19, 1956   64 y.o. Female  MRN: [id number] Visit Date: 05/09/2020  Today's healthcare provider: Lelon Huh, MD   Chief Complaint  Patient presents with  . Anxiety  . Rash   Subjective    Rash This is a recurrent problem. The current episode started more than 1 month ago. The problem has been waxing and waning since onset. The affected locations include the scalp, head, neck, chest, torso, back, genitalia, groin and abdomen. The rash is characterized by itchiness and dryness. Pertinent negatives include no cough, fever or rhinorrhea. Past treatments include anti-itch cream. The treatment provided mild relief. Her past medical history is significant for eczema.   She saw Fabio Bering in July and prescribed prednisone and steroid cream. States it helped a bit with itching, but lesions never improved. Have now spread to waistline, over back, neck, and scalp.   Patient Active Problem List   Diagnosis Date Noted  . Pain in pelvis 04/11/2016  . Radiculopathy 04/11/2016  . Benign neoplasm of sigmoid colon   . Personal history of colonic polyps   . Benign neoplasm of colon   . Obesity 08/27/2015  . H/O adenomatous polyp of colon 07/20/2015  . Personal history of methicillin resistant Staphylococcus aureus 07/20/2015  . Eczema intertrigo 07/20/2015  . Essential (primary) hypertension 02/19/2006   Social History   Tobacco Use  . Smoking status: Never Smoker  . Smokeless tobacco: Never Used  Substance Use Topics  . Alcohol use: No    Alcohol/week: 0.0 standard drinks  . Drug use: No   No Known Allergies     Medications: Outpatient Medications Prior to Visit  Medication Sig  . amLODipine-benazepril (LOTREL) 10-40 MG capsule Take 1 capsule by mouth daily.  . Clobetasol Propionate 0.05 % shampoo Use once or twice daily  . meloxicam (MOBIC) 15 MG tablet TAKE 1 TABLET BY MOUTH EVERY DAY AS NEEDED FOR PAIN   No  facility-administered medications prior to visit.    Review of Systems  Constitutional: Negative for fever.  HENT: Negative for rhinorrhea.   Respiratory: Negative for cough.   Skin: Positive for rash.      Objective    BP 140/88 (BP Location: Right Arm, Patient Position: Sitting, Cuff Size: Large)   Pulse 80   Temp 98.9 F (37.2 C) (Oral)   Resp 16   Ht 5\' 3"  (1.6 m)   Wt 221 lb (100.2 kg)   SpO2 98%   BMI 39.15 kg/m  BP Readings from Last 3 Encounters:  05/09/20 140/88  01/30/20 129/75  12/28/19 (!) 148/92   Wt Readings from Last 3 Encounters:  05/09/20 221 lb (100.2 kg)  01/30/20 219 lb 9.6 oz (99.6 kg)  12/28/19 217 lb (98.4 kg)      Physical Exam  Diffusely scattered scaly pigmented lesions distributed as per HPI, anywhere from 3/4 to 2 cm across, slightly raised.   No results found for any visits on 05/09/20.  Assessment & Plan     1. Tinea corporis (suspected) Possibly kerrions versus chronic inflammatory dermatitis, although did not improve with steroids. Will try topical  ketoconazole (NIZORAL) 2 % cream; Apply 1 application topically daily.  Dispense: 60 g; Refill: 1   Will get prescription for antifungal shampoo if responding to cream, if not will refer to dermatology.       The entirety of the information documented in the History of Present Illness, Review of  Systems and Physical Exam were personally obtained by me. Portions of this information were initially documented by the CMA and reviewed by me for thoroughness and accuracy.      Lelon Huh, MD  [facility] [phone] (phone) [phone] (fax)  [place]

## 2020-05-23 ENCOUNTER — Telehealth: Payer: Self-pay | Admitting: Family Medicine

## 2020-05-23 DIAGNOSIS — B354 Tinea corporis: Secondary | ICD-10-CM

## 2020-05-25 NOTE — Telephone Encounter (Signed)
Please check with patient to see if rash is starting to improve since starting prescription cream earlier this month. If so then continue using until cleared, If not then will need to refer to dermatology.

## 2020-05-28 NOTE — Telephone Encounter (Signed)
I called and spoke with patient. She states the rash has improved some, but she has some new spots that appeared. Patient says they have a white appearance as if there is some type of yeast infection. Patient agrees to dermatology referral. Order placed. Patient also requested a prescription for yeast in the skin. I advised her that she needs an office visit to have skin re-evaluated. Appointment scheduled with Vernie Murders on 05/29/2020 at 3:20pm

## 2020-05-29 ENCOUNTER — Ambulatory Visit: Payer: 59 | Admitting: Family Medicine

## 2020-05-29 ENCOUNTER — Encounter: Payer: Self-pay | Admitting: Family Medicine

## 2020-05-29 ENCOUNTER — Other Ambulatory Visit: Payer: Self-pay

## 2020-05-29 VITALS — BP 136/67 | HR 68 | Temp 98.1°F | Resp 16 | Wt 219.0 lb

## 2020-05-29 DIAGNOSIS — R198 Other specified symptoms and signs involving the digestive system and abdomen: Secondary | ICD-10-CM

## 2020-05-29 DIAGNOSIS — B354 Tinea corporis: Secondary | ICD-10-CM

## 2020-05-29 MED ORDER — DOXYCYCLINE HYCLATE 100 MG PO TABS: 100.0000 mg | ORAL_TABLET | Freq: Two times a day (BID) | ORAL | 0 refills | Status: DC

## 2020-05-29 NOTE — Progress Notes (Signed)
Established patient visit   Patient: Nicole Tyler   DOB: Nov 14, 1955   64 y.o. Female  MRN: [id number] Visit Date: 05/29/2020  Today's healthcare provider: Vernie Murders, PA-C   Chief Complaint  Patient presents with   Rash   Subjective    Rash This is a recurrent problem. Pertinent negatives include no fatigue, fever, shortness of breath or vomiting.    Patient was last seen on 05/09/2020 by Dr. Caryn Section for Tinea Corporis. She was prescribed Ketoconazole cream. Patient reports the cream helped some, but she is still having new lesions pop up on various locations of her body. Patient states she has one lesion in her belly button that has a foul smell with a white discharge.   Past Medical History:  Diagnosis Date   Hypertension    Past Surgical History:  Procedure Laterality Date   BUNIONECTOMY Right    FLEXIBLE SIGMOIDOSCOPY N/A 09/17/2015   Procedure: FLEXIBLE SIGMOIDOSCOPY;  Surgeon: Lucilla Lame, MD;  Location: [place];  Service: Endoscopy;  Laterality: N/A;   FLEXIBLE SIGMOIDOSCOPY N/A 02/08/2016   Procedure: FLEXIBLE SIGMOIDOSCOPY;  Surgeon: Lucilla Lame, MD;  Location: [place];  Service: Endoscopy;  Laterality: N/A;   HAND SURGERY  1984   has repair to lt hand secondary to [facility] injury caught in machine rollers.   POLYPECTOMY  09/17/2015   Procedure: POLYPECTOMY INTESTINAL;  Surgeon: Lucilla Lame, MD;  Location: [place];  Service: Endoscopy;;  Sigmoid colon polyp ( several pieces from the same polyp)     Social History   Tobacco Use   Smoking status: Never Smoker   Smokeless tobacco: Never Used  Substance Use Topics   Alcohol use: No    Alcohol/week: 0.0 standard drinks   Drug use: No   Family History  Problem Relation Age of Onset   Hypertension Mother    Hypertension Father    Colon cancer Father    Asthma Daughter    Hypertension Maternal Grandmother    Stroke Maternal Grandmother    Hypertension  Daughter    No Known Allergies     Medications: Outpatient Medications Prior to Visit  Medication Sig   amLODipine-benazepril (LOTREL) 10-40 MG capsule Take 1 capsule by mouth daily.   Clobetasol Propionate 0.05 % shampoo Use once or twice daily   ketoconazole (NIZORAL) 2 % cream Apply 1 application topically daily.   meloxicam (MOBIC) 15 MG tablet TAKE 1 TABLET BY MOUTH EVERY DAY AS NEEDED FOR PAIN   No facility-administered medications prior to visit.    Review of Systems  Constitutional: Negative for appetite change, chills, fatigue and fever.  Respiratory: Negative for chest tightness and shortness of breath.   Cardiovascular: Negative for chest pain and palpitations.  Gastrointestinal: Negative for abdominal pain, nausea and vomiting.  Skin: Positive for rash.  Neurological: Negative for dizziness and weakness.      Objective    BP 136/67 (BP Location: Right Arm, Patient Position: Sitting, Cuff Size: Large)    Pulse 68    Temp 98.1 F (36.7 C) (Temporal)    Resp 16    Wt 219 lb (99.3 kg)    BMI 38.79 kg/m    Physical Exam Constitutional:      General: She is not in acute distress.    Appearance: She is well-developed and well-nourished.  HENT:     Head: Normocephalic and atraumatic.     Right Ear: Hearing normal.     Left Ear: Hearing normal.  Nose: Nose normal.  Eyes:     General: Lids are normal. No scleral icterus.       Right eye: No discharge.        Left eye: No discharge.     Conjunctiva/sclera: Conjunctivae normal.  Pulmonary:     Effort: Pulmonary effort is normal. No respiratory distress.  Musculoskeletal:        General: Normal range of motion.  Skin:    General: Skin is intact.     Findings: No lesion or rash.  Neurological:     Mental Status: She is alert and oriented to person, place, and time.  Psychiatric:        Mood and Affect: Mood and affect normal.        Speech: Speech normal.        Behavior: Behavior normal.         Thought Content: Thought content normal.      No results found for any visits on 05/29/20.  Assessment & Plan     1. Tinea corporis Improvement with use of Ketoconazole cream, but, some new lesions appearing. Will continue treatment. If it persists, may need dermatology referral.  2. Umbilicus discharge Similar lesion of umbilicus with white to yellow discharge. Slightly uncomfortable to palpate but no significant erythema. Suspect secondary infection from moisture collection. Keep the area clean and dry. Treat with antibiotic and will get culture. - doxycycline (VIBRA-TABS) 100 MG tablet; Take 1 tablet (100 mg total) by mouth 2 (two) times daily.  Dispense: 20 tablet; Refill: 0 - Aerobic culture   No follow-ups on file.      I, Chase Arnall, PA-C, have reviewed all documentation for this visit. The documentation on 05/29/20 for the exam, diagnosis, procedures, and orders are all accurate and complete.    Vernie Murders, PA-C  [employer] [phone] (phone) [phone] (fax)  [place]

## 2020-06-04 LAB — AEROBIC CULTURE

## 2020-06-18 ENCOUNTER — Other Ambulatory Visit: Payer: Self-pay

## 2020-06-18 ENCOUNTER — Ambulatory Visit: Payer: 59 | Admitting: Family Medicine

## 2020-06-18 ENCOUNTER — Encounter: Payer: Self-pay | Admitting: Family Medicine

## 2020-06-18 DIAGNOSIS — N898 Other specified noninflammatory disorders of vagina: Secondary | ICD-10-CM

## 2020-06-18 DIAGNOSIS — L309 Dermatitis, unspecified: Secondary | ICD-10-CM

## 2020-06-18 DIAGNOSIS — L299 Pruritus, unspecified: Secondary | ICD-10-CM | POA: Diagnosis not present

## 2020-06-18 LAB — POCT WET PREP WITH KOH
Clue Cells Wet Prep HPF POC: NEGATIVE
KOH Prep POC: POSITIVE — AB
RBC Wet Prep HPF POC: NEGATIVE
Trichomonas, UA: NEGATIVE
Yeast Wet Prep HPF POC: POSITIVE

## 2020-06-18 MED ORDER — FLUCONAZOLE 150 MG PO TABS: 150.0000 mg | ORAL_TABLET | Freq: Once | ORAL | 0 refills | Status: AC

## 2020-06-18 MED ORDER — DOXYCYCLINE HYCLATE 100 MG PO TABS: 100.0000 mg | ORAL_TABLET | Freq: Two times a day (BID) | ORAL | 0 refills | Status: DC

## 2020-06-18 NOTE — Progress Notes (Signed)
Acute Office Visit  Subjective:    Patient ID: Nicole Tyler, female    DOB: Jun 21, 1955, 65 y.o.   MRN: [id number]  No chief complaint on file.   HPI Patient is in today for evaluation of rash.  Patient states she has been being treated for this since August.  She reports it has gotten worse in that it has spread.  She reports it is itching and pustular.   Past Medical History:  Diagnosis Date  . Hypertension     Past Surgical History:  Procedure Laterality Date  . BUNIONECTOMY Right   . FLEXIBLE SIGMOIDOSCOPY N/A 09/17/2015   Procedure: FLEXIBLE SIGMOIDOSCOPY;  Surgeon: Lucilla Lame, MD;  Location: [place];  Service: Endoscopy;  Laterality: N/A;  . FLEXIBLE SIGMOIDOSCOPY N/A 02/08/2016   Procedure: FLEXIBLE SIGMOIDOSCOPY;  Surgeon: Lucilla Lame, MD;  Location: [place];  Service: Endoscopy;  Laterality: N/A;  . HAND SURGERY  1984   has repair to lt hand secondary to [facility] injury caught in machine rollers.  Marland Kitchen POLYPECTOMY  09/17/2015   Procedure: POLYPECTOMY INTESTINAL;  Surgeon: Lucilla Lame, MD;  Location: [place];  Service: Endoscopy;;  Sigmoid colon polyp ( several pieces from the same polyp)      Family History  Problem Relation Age of Onset  . Hypertension Mother   . Hypertension Father   . Colon cancer Father   . Asthma Daughter   . Hypertension Maternal Grandmother   . Stroke Maternal Grandmother   . Hypertension Daughter     Social History   Socioeconomic History  . Marital status: Married    Spouse name: Not on file  . Number of children: Not on file  . Years of education: Not on file  . 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 Smoking status: Never Smoker  . Smokeless tobacco: Never Used  Substance and Sexual Activity  . Alcohol use: No    Alcohol/week: 0.0 standard drinks  . Drug use: No  . Sexual activity: Not on file  Other Topics Concern   . Not on file  Social History Narrative  . Not on file   Social Determinants of Health   Financial Resource Strain: Not on file  Food Insecurity: Not on file  Transportation Needs: Not on file  Physical Activity: Not on file  Stress: Not on file  Social Connections: Not on file  Intimate Partner Violence: Not on file    Outpatient Medications Prior to Visit  Medication Sig Dispense Refill  . amLODipine-benazepril (LOTREL) 10-40 MG capsule Take 1 capsule by mouth daily. 90 capsule 1  . Clobetasol Propionate 0.05 % shampoo Use once or twice daily 118 mL 0  . doxycycline (VIBRA-TABS) 100 MG tablet Take 1 tablet (100 mg total) by mouth 2 (two) times daily. 20 tablet 0  . ketoconazole (NIZORAL) 2 % cream Apply 1 application topically daily. 60 g 1  . meloxicam (MOBIC) 15 MG tablet TAKE 1 TABLET BY MOUTH EVERY DAY AS NEEDED FOR PAIN 30 tablet 1   No facility-administered medications prior to visit.    No Known Allergies  Review of Systems  Constitutional: Negative for fever.  Skin: Positive for rash.       Objective:    Physical Exam  There were no vitals taken for this visit. Wt Readings from Last 3 Encounters:  05/29/20 219 lb (99.3 kg)  05/09/20 221 lb (  100.2 kg)  01/30/20 219 lb 9.6 oz (99.6 kg)    Health Maintenance Due  Topic Date Due  . Hepatitis C Screening  Never done  . HIV Screening  Never done  . MAMMOGRAM  Never done  . PAP SMEAR-Modifier  08/16/2019  . COLONOSCOPY (Pts 45-24yrs Insurance coverage will need to be confirmed)  09/15/2019    There are no preventive care reminders to display for this patient.   Lab Results  Component Value Date   TSH 2.31 06/18/2010   Lab Results  Component Value Date   WBC 6.4 01/30/2020   HGB 13.5 01/30/2020   HCT 42.0 01/30/2020   MCV 91 01/30/2020   PLT 175 01/30/2020   Lab Results  Component Value Date   NA 141 01/30/2020   K 3.8 01/30/2020   CO2 25 01/30/2020   GLUCOSE 87 01/30/2020   BUN 12  01/30/2020   CREATININE 0.84 01/30/2020   BILITOT 0.4 01/30/2020   ALKPHOS 66 01/30/2020   AST 21 01/30/2020   ALT 14 01/30/2020   PROT 7.3 01/30/2020   ALBUMIN 4.3 01/30/2020   CALCIUM 9.4 01/30/2020   Lab Results  Component Value Date   CHOL 201 (H) 01/30/2020   Lab Results  Component Value Date   HDL 56 01/30/2020   Lab Results  Component Value Date   LDLCALC 130 (H) 01/30/2020   Lab Results  Component Value Date   TRIG 81 01/30/2020   Lab Results  Component Value Date   CHOLHDL 3.6 01/30/2020   No results found for: HGBA1C     Assessment & Plan:   1. Dermatitis Some irritation and itching of upper body and back with purpura type rash of scalp with itching. Was a little better with the Doxycycline given in December. With persistence will check CBC, RPR and HIV tests. Schedule dermatology referral sooner than May 2022. - CBC with Differential/Platelet - RPR - doxycycline (VIBRA-TABS) 100 MG tablet; Take 1 tablet (100 mg total) by mouth 2 (two) times daily.  Dispense: 20 tablet; Refill: 0 - HIV Antibody (routine testing w rflx)  2. Pruritus Multiple scabbed pruritic lesions on thorax over the past several months. Lesions start as blisters or pustules. Will check RPR and HIV tests. May use Benadryl prn and try to schedule dermatology referral sooner. - CBC with Differential/Platelet - RPR - HIV Antibody (routine testing w rflx)  3. Vaginal irritation Slight gray discharge without erythema or blisters. No ulcerations. Yeast on wet prep. Treat with Diflucan and check labs. - RPR - POCT Wet Prep with KOH - fluconazole (DIFLUCAN) 150 MG tablet; Take 1 tablet (150 mg total) by mouth once for 1 dose.  Dispense: 1 tablet; Refill: 0    No orders of the defined types were placed in this encounter.  I, Jaynie Hitch, PA-C, have reviewed all documentation for this visit. The documentation on 06/18/20 for the exam, diagnosis, procedures, and orders are all accurate and  complete.   Juluis Mire, CMA

## 2020-06-19 LAB — CBC WITH DIFFERENTIAL/PLATELET
Basophils Absolute: 0 10*3/uL (ref 0.0–0.2)
Basos: 0 %
EOS (ABSOLUTE): 0.1 10*3/uL (ref 0.0–0.4)
Eos: 2 %
Hematocrit: 41.2 % (ref 34.0–46.6)
Hemoglobin: 13.8 g/dL (ref 11.1–15.9)
Immature Grans (Abs): 0 10*3/uL (ref 0.0–0.1)
Immature Granulocytes: 0 %
Lymphocytes Absolute: 3.1 10*3/uL (ref 0.7–3.1)
Lymphs: 50 %
MCH: 29.8 pg (ref 26.6–33.0)
MCHC: 33.5 g/dL (ref 31.5–35.7)
MCV: 89 fL (ref 79–97)
Monocytes Absolute: 0.5 10*3/uL (ref 0.1–0.9)
Monocytes: 8 %
Neutrophils Absolute: 2.4 10*3/uL (ref 1.4–7.0)
Neutrophils: 40 %
Platelets: 175 10*3/uL (ref 150–450)
RBC: 4.63 x10E6/uL (ref 3.77–5.28)
RDW: 13.5 % (ref 11.7–15.4)
WBC: 6.1 10*3/uL (ref 3.4–10.8)

## 2020-06-19 LAB — RPR: RPR Ser Ql: NONREACTIVE

## 2020-06-19 LAB — HIV ANTIBODY (ROUTINE TESTING W REFLEX): HIV Screen 4th Generation wRfx: NONREACTIVE

## 2020-06-26 ENCOUNTER — Other Ambulatory Visit: Payer: Self-pay

## 2020-06-26 ENCOUNTER — Ambulatory Visit
Admission: RE | Admit: 2020-06-26 | Discharge: 2020-06-26 | Disposition: A | Discharge status: Home / Self Care | Payer: 59 | Source: Ambulatory Visit | Attending: Family Medicine | Admitting: Family Medicine

## 2020-06-26 DIAGNOSIS — Z1231 Encounter for screening mammogram for malignant neoplasm of breast: Secondary | ICD-10-CM | POA: Diagnosis not present

## 2020-08-02 ENCOUNTER — Other Ambulatory Visit: Payer: Self-pay | Admitting: Physician Assistant

## 2020-08-02 DIAGNOSIS — I10 Essential (primary) hypertension: Secondary | ICD-10-CM

## 2020-08-03 ENCOUNTER — Encounter: Payer: Self-pay | Admitting: Family Medicine

## 2020-08-03 ENCOUNTER — Other Ambulatory Visit: Payer: Self-pay

## 2020-08-03 ENCOUNTER — Ambulatory Visit (INDEPENDENT_AMBULATORY_CARE_PROVIDER_SITE_OTHER): Payer: Medicare Other | Admitting: Family Medicine

## 2020-08-03 VITALS — BP 139/76 | HR 69 | Temp 97.7°F | Resp 16 | Wt 221.8 lb

## 2020-08-03 DIAGNOSIS — I10 Essential (primary) hypertension: Secondary | ICD-10-CM

## 2020-08-03 DIAGNOSIS — Z86010 Personal history of colonic polyps: Secondary | ICD-10-CM | POA: Diagnosis not present

## 2020-08-03 DIAGNOSIS — K219 Gastro-esophageal reflux disease without esophagitis: Secondary | ICD-10-CM

## 2020-08-03 DIAGNOSIS — Z23 Encounter for immunization: Secondary | ICD-10-CM | POA: Diagnosis not present

## 2020-08-03 DIAGNOSIS — L409 Psoriasis, unspecified: Secondary | ICD-10-CM | POA: Insufficient documentation

## 2020-08-03 MED ORDER — OMEPRAZOLE 20 MG PO CPDR
20.0000 mg | DELAYED_RELEASE_CAPSULE | Freq: Every day | ORAL | 3 refills | Status: DC
Start: 2020-08-03 — End: 2021-01-15

## 2020-08-03 MED ORDER — AMLODIPINE BESY-BENAZEPRIL HCL 10-40 MG PO CAPS: 1.0000 | ORAL_CAPSULE | Freq: Every day | ORAL | 3 refills | Status: DC

## 2020-08-03 NOTE — Progress Notes (Signed)
Established patient visit   Patient: Nicole Tyler   DOB: 12-Aug-1955   65 y.o. Female  MRN: [id number] Visit Date: 08/03/2020  Today's healthcare provider: Lelon Huh, MD   Chief Complaint  Patient presents with  . Hypertension   Subjective    HPI  Hypertension, follow-up  BP Readings from Last 3 Encounters:  08/03/20 139/76  05/29/20 136/67  05/09/20 140/88   Wt Readings from Last 3 Encounters:  08/03/20 221 lb 12.8 oz (100.6 kg)  05/29/20 219 lb (99.3 kg)  05/09/20 221 lb (100.2 kg)     She was last seen for hypertension 6 months ago.  BP at that visit was 129/75. Management since that visit includes continue current dose of amlodipine.  She reports good compliance with treatment. She is not having side effects.  She is following a Regular diet. She is not exercising. She does not smoke.  Use of agents associated with hypertension: none.   Outside blood pressures are checked and average 140/80. Symptoms: Yes chest pain (heaviness on left side of chest; occurs after eating and when lying down at night) No chest pressure  No palpitations No syncope  No dyspnea No orthopnea  No paroxysmal nocturnal dyspnea No lower extremity edema   Pertinent labs: Lab Results  Component Value Date   CHOL 201 (H) 01/30/2020   HDL 56 01/30/2020   LDLCALC 130 (H) 01/30/2020   TRIG 81 01/30/2020   CHOLHDL 3.6 01/30/2020   Lab Results  Component Value Date   NA 141 01/30/2020   K 3.8 01/30/2020   CREATININE 0.84 01/30/2020   GFRNONAA 74 01/30/2020   GFRAA 85 01/30/2020   GLUCOSE 87 01/30/2020     The 10-year ASCVD risk score Mikey Bussing DC Jr., et al., 2013) is: 11.1%   --------------------------------------------------------------------------------------------------- She states over the last several weeks she has had symptoms of pain and heaviness in her chest at times. It is not related to exertions. Sometimes occurs after lying down, but usually after eating and  resolves with OTC antacids.     Medications: Outpatient Medications Prior to Visit  Medication Sig  . amLODipine-benazepril (LOTREL) 10-40 MG capsule TAKE 1 CAPSULE BY MOUTH EVERY DAY  . Clobetasol Propionate 0.05 % shampoo Use once or twice daily  . doxycycline (VIBRA-TABS) 100 MG tablet Take 1 tablet (100 mg total) by mouth 2 (two) times daily.  Marland Kitchen ketoconazole (NIZORAL) 2 % cream Apply 1 application topically daily.  . meloxicam (MOBIC) 15 MG tablet TAKE 1 TABLET BY MOUTH EVERY DAY AS NEEDED FOR PAIN   No facility-administered medications prior to visit.    Review of Systems  Constitutional: Negative for appetite change, chills, fatigue and fever.  Respiratory: Negative for chest tightness and shortness of breath.   Cardiovascular: Positive for chest pain (left side chest pain started 2-3 days ago ). Negative for palpitations.  Gastrointestinal: Negative for abdominal pain, nausea and vomiting.  Musculoskeletal: Positive for back pain.  Neurological: Negative for dizziness and weakness.       Objective    BP 139/76 (BP Location: Left Arm, Patient Position: Sitting, Cuff Size: Large)   Pulse 69   Temp 97.7 F (36.5 C) (Temporal)   Resp 16   Wt 221 lb 12.8 oz (100.6 kg)   BMI 39.29 kg/m     Physical Exam   General: Appearance:    Obese female in no acute distress  Eyes:    PERRL, conjunctiva/corneas clear, EOM's intact  Lungs:     Clear to auscultation bilaterally, respirations unlabored  Heart:    Normal heart rate. Normal rhythm. No murmurs, rubs, or gallops.   MS:   All extremities are intact.   Neurologic:   Awake, alert, oriented x 3. No apparent focal neurological           defect.       EKG- no acute changes.    Assessment & Plan     1. Essential (primary) hypertension  - EKG 12-Lead - amLODipine-benazepril (LOTREL) 10-40 MG capsule; Take 1 capsule by mouth daily.  Dispense: 90 capsule; Refill: 3  2. Gastroesophageal reflux disease, unspecified  whether esophagitis present Symptoms are typical for GERD. Will start - omeprazole (PRILOSEC) 20 MG capsule; Take 1 capsule (20 mg total) by mouth daily.  Dispense: 30 capsule; Refill: 3  Will contact patient in a few weeks to see how responding to PPI. If not resolved will need to rule out atypical angina.   3. Need for vaccination against Streptococcus pneumoniae  - Pneumococcal polysaccharide vaccine 23-valent greater than or equal to 2yo subcutaneous/IM  4. H/O adenomatous polyp of colon Patient is due for follow colonoscopy. Forgot to remind her she is due but will advise her and place referral at time of telephone follow up.   5. Psoriasis Per dermatology. Recently started on  - triamcinolone ointment (KENALOG) 0.1 %; Apply topically 2 (two) times daily. - clobetasol ointment (TEMOVATE) 0.05 %; Apply topically 2 (two) times daily.        The entirety of the information documented in the History of Present Illness, Review of Systems and Physical Exam were personally obtained by me. Portions of this information were initially documented by the CMA and reviewed by me for thoroughness and accuracy.      Lelon Huh, MD  [facility] [phone] (phone) [phone] (fax)  [place]

## 2020-08-21 ENCOUNTER — Telehealth: Payer: Self-pay | Admitting: Family Medicine

## 2020-08-21 DIAGNOSIS — K219 Gastro-esophageal reflux disease without esophagitis: Secondary | ICD-10-CM

## 2020-08-21 DIAGNOSIS — Z86010 Personal history of colonic polyps: Secondary | ICD-10-CM

## 2020-08-21 NOTE — Telephone Encounter (Signed)
Patient was seen in February with chest pressure and prescribed omeprazole for reflux. Please see if sx have resolved since then. If so should stay on omeprazole, if not then refer to cardiology.   She is also due for follow up colonoscopy due to history of colon polyps. Can sign off on order if she is ready for it to be placed.

## 2020-08-22 NOTE — Telephone Encounter (Signed)
Tried callin patient. Left message to call back. OK for [facility] triage to advise and then send message back to office with patients response.

## 2020-08-22 NOTE — Telephone Encounter (Signed)
Per initial encounter," Patient was seen in February with chest pressure and prescribed omeprazole for reflux. Please see if sx have resolved since then. If so should stay on omeprazole, if not then refer to cardiology. She is also due for follow up colonoscopy due to history of colon polyps. Can sign off on order if she is ready for it to be placed"; pt called back and says she takes the medication 3-4 nights per week, and her symptoms occur depending on what she eats; reviewed instructions for medication to be taken daily; she again says her symptoms are based on what she eats; the pt would like further recommendations per Dr Caryn Section; she can be contacted at [phone]; will route to office for provider review.

## 2020-09-24 ENCOUNTER — Encounter: Payer: Self-pay | Admitting: *Deleted

## 2020-10-25 ENCOUNTER — Ambulatory Visit: Payer: Self-pay | Admitting: Dermatology

## 2021-01-15 ENCOUNTER — Other Ambulatory Visit: Payer: Self-pay | Admitting: Family Medicine

## 2021-01-15 DIAGNOSIS — K219 Gastro-esophageal reflux disease without esophagitis: Secondary | ICD-10-CM

## 2021-01-17 DIAGNOSIS — L2089 Other atopic dermatitis: Secondary | ICD-10-CM | POA: Diagnosis not present

## 2021-01-17 DIAGNOSIS — Z7689 Persons encountering health services in other specified circumstances: Secondary | ICD-10-CM | POA: Diagnosis not present

## 2021-04-19 DIAGNOSIS — Z79899 Other long term (current) drug therapy: Secondary | ICD-10-CM | POA: Diagnosis not present

## 2021-04-19 DIAGNOSIS — L2089 Other atopic dermatitis: Secondary | ICD-10-CM | POA: Diagnosis not present

## 2021-04-19 DIAGNOSIS — Z7689 Persons encountering health services in other specified circumstances: Secondary | ICD-10-CM | POA: Diagnosis not present

## 2021-04-30 ENCOUNTER — Other Ambulatory Visit: Payer: Self-pay

## 2021-04-30 ENCOUNTER — Ambulatory Visit (INDEPENDENT_AMBULATORY_CARE_PROVIDER_SITE_OTHER): Payer: Medicare Other | Admitting: Gastroenterology

## 2021-04-30 ENCOUNTER — Encounter: Payer: Self-pay | Admitting: Gastroenterology

## 2021-04-30 VITALS — BP 143/76 | HR 80 | Ht 63.0 in | Wt 219.0 lb

## 2021-04-30 DIAGNOSIS — K219 Gastro-esophageal reflux disease without esophagitis: Secondary | ICD-10-CM | POA: Diagnosis not present

## 2021-04-30 DIAGNOSIS — Z86010 Personal history of colonic polyps: Secondary | ICD-10-CM

## 2021-04-30 MED ORDER — CLENPIQ 10-3.5-12 MG-GM -GM/160ML PO SOLN: 320.0000 mL | ORAL | 0 refills | Status: DC

## 2021-04-30 NOTE — Progress Notes (Signed)
Gastroenterology Consultation  Referring Provider:     Birdie Sons, MD Primary Care Physician:  Birdie Sons, MD Primary Gastroenterologist:  Dr. Allen Norris     Reason for Consultation:     GERD        HPI:   Nicole Tyler is a 65 y.o. y/o female referred for consultation & management of GERD by Dr. Caryn Section, Kirstie Peri, MD. This patient comes in today after having a colonoscopy by me with a large polyp that was removed.  The patient then had a follow-up few months later to make sure that the polyp had been completely excised.  The patient was seen earlier this year by her primary care provider and had reported that she was taking her omeprazole for chest pain that was working when she took the medication.  She was taking the medication only 3 to 4 days a week and was encouraged by her primary care provider to take it every day.  Was also noted that she was due for colonoscopy therefore she was sent back to me for evaluation. She was diagnoses with eczema and she thinks its around her anus. She has some pain around the anus also. She reports the pain as a pressure-like pain and can come when she is standing up sitting down or moving her bowels.  Past Medical History:  Diagnosis Date   Benign neoplasm of colon    Benign neoplasm of sigmoid colon    Hypertension     Past Surgical History:  Procedure Laterality Date   BUNIONECTOMY Right    FLEXIBLE SIGMOIDOSCOPY N/A 09/17/2015   Procedure: FLEXIBLE SIGMOIDOSCOPY;  Surgeon: Lucilla Lame, MD;  Location: [place];  Service: Endoscopy;  Laterality: N/A;   FLEXIBLE SIGMOIDOSCOPY N/A 02/08/2016   Procedure: FLEXIBLE SIGMOIDOSCOPY;  Surgeon: Lucilla Lame, MD;  Location: [place];  Service: Endoscopy;  Laterality: N/A;   HAND SURGERY  1984   has repair to lt hand secondary to [facility] injury caught in machine rollers.   POLYPECTOMY  09/17/2015   Procedure: POLYPECTOMY INTESTINAL;  Surgeon: Lucilla Lame, MD;  Location: [place];  Service: Endoscopy;;  Sigmoid colon polyp ( several pieces from the same polyp)      Prior to Admission medications   Medication Sig Start Date End Date Taking? Authorizing Provider  amLODipine-benazepril (LOTREL) 10-40 MG capsule Take 1 capsule by mouth daily. 08/03/20   Birdie Sons, MD  clobetasol ointment (TEMOVATE) 0.05 % Apply topically 2 (two) times daily. 07/25/20   [provider]  DUPIXENT 300 MG/2ML SOPN Inject into the skin. 04/24/21   [provider]  ketoconazole (NIZORAL) 2 % cream Apply 1 application topically daily. 05/09/20   Birdie Sons, MD  meloxicam (MOBIC) 15 MG tablet TAKE 1 TABLET BY MOUTH EVERY DAY AS NEEDED FOR PAIN 03/30/20   Birdie Sons, MD  omeprazole (PRILOSEC) 20 MG capsule TAKE 1 CAPSULE BY MOUTH EVERY DAY 01/15/21   Birdie Sons, MD  predniSONE (DELTASONE) 10 MG tablet Take by mouth. 04/19/21   [provider]  triamcinolone ointment (KENALOG) 0.1 % Apply topically 2 (two) times daily. 07/11/20   [provider]    Family History  Problem Relation Age of Onset   Hypertension Mother    Hypertension Father    Colon cancer Father    Asthma Daughter    Breast cancer Daughter    Hypertension Maternal Grandmother    Stroke Maternal Grandmother  Breast cancer Maternal Grandmother    Hypertension Daughter      Social History   Tobacco Use   Smoking status: Never   Smokeless tobacco: Never  Substance Use Topics   Alcohol use: No    Alcohol/week: 0.0 standard drinks   Drug use: No    Allergies as of 04/30/2021   (No Known Allergies)    Review of Systems:    All systems reviewed and negative except where noted in HPI.   Physical Exam:  There were no vitals taken for this visit. No LMP recorded. Patient is postmenopausal. General:   Alert,  Well-developed, well-nourished, pleasant and cooperative in NAD Head:  Normocephalic and atraumatic. Eyes:  Sclera clear, no icterus.   Conjunctiva  pink. Ears:  Normal auditory acuity. Neck:  Supple; no masses or thyromegaly. Lungs:  Respirations even and unlabored.  Clear throughout to auscultation.   No wheezes, crackles, or rhonchi. No acute distress. Heart:  Regular rate and rhythm; no murmurs, clicks, rubs, or gallops. Abdomen:  Normal bowel sounds.  No bruits.  Soft, non-tender and non-distended without masses, hepatosplenomegaly or hernias noted.  No guarding or rebound tenderness.  Negative Carnett sign.   Rectal:  Deferred.  Pulses:  Normal pulses noted. Extremities:  No clubbing or edema.  No cyanosis. Neurologic:  Alert and oriented x3;  grossly normal neurologically. Skin:  Intact without significant lesions or rashes.  No jaundice. Lymph Nodes:  No significant cervical adenopathy. Psych:  Alert and cooperative. Normal mood and affect.  Imaging Studies: No results found.  Assessment and Plan:   Nicole Tyler is a 65 y.o. y/o female who comes in today with a history of colon polyps and is due for repeat colonoscopy.  The patient has some perianal pain that she is not sure it if is from her colon or from her eczema. The patient will follow-up with a colonoscopy since her last procedure was in 2017.  The patient has been explained the plan and agrees with it.    Lucilla Lame, MD. Marval Regal    Note: This dictation was prepared with Dragon dictation along with smaller phrase technology. Any transcriptional errors that result from this process are unintentional.

## 2021-05-20 ENCOUNTER — Other Ambulatory Visit: Payer: Self-pay | Admitting: *Deleted

## 2021-05-20 ENCOUNTER — Other Ambulatory Visit: Payer: Self-pay | Admitting: Family Medicine

## 2021-05-20 DIAGNOSIS — K219 Gastro-esophageal reflux disease without esophagitis: Secondary | ICD-10-CM

## 2021-05-20 MED ORDER — OMEPRAZOLE 20 MG PO CPDR: DELAYED_RELEASE_CAPSULE | ORAL | 0 refills | Status: DC

## 2021-05-20 MED ORDER — OMEPRAZOLE 20 MG PO CPDR: DELAYED_RELEASE_CAPSULE | ORAL | 0 refills | Status: AC

## 2021-05-20 NOTE — Telephone Encounter (Signed)
Copied from [place] [id number]. Topic: Quick Communication - Rx Refill/Question >> May 20, 2021 12:38 PM Tessa Lerner A wrote: Medication: omeprazole (PRILOSEC) 20 MG capsule [[id number]]   Has the patient contacted their pharmacy? Yes.  The patient's pharmacy has made contact on their behalf  (Agent: If no, request that the patient contact the pharmacy for the refill. If patient does not wish to contact the pharmacy document the reason why and proceed with request.) (Agent: If yes, when and what did the pharmacy advise?)  Preferred Pharmacy (with phone number or street name): [place], [place]  Phone:  [phone] Fax:  [phone]    Has the patient been seen for an appointment in the last year OR does the patient have an upcoming appointment? Yes.    Agent: Please be advised that RX refills may take up to 3 business days. We ask that you follow-up with your pharmacy.

## 2021-05-20 NOTE — Telephone Encounter (Signed)
Requested Prescriptions  Pending Prescriptions Disp Refills  . omeprazole (PRILOSEC) 20 MG capsule 90 capsule 0    Sig: TAKE 1 CAPSULE BY MOUTH EVERY DAY Strength: 20 mg     Gastroenterology: Proton Pump Inhibitors Passed - 05/20/2021  1:26 PM      Passed - Valid encounter within last 12 months    Recent Outpatient Visits          9 months ago Essential (primary) hypertension   [facility] Birdie Sons, MD   11 months ago Dermatitis   [facility], Vickki Muff, PA-C   11 months ago Tinea corporis   [facility], Vickki Muff, PA-C   1 year ago Tinea corporis   [facility] Birdie Sons, MD   1 year ago Essential (primary) hypertension   [facility], Kirstie Peri, MD

## 2021-05-22 ENCOUNTER — Encounter: Payer: Self-pay | Admitting: Gastroenterology

## 2021-05-31 ENCOUNTER — Encounter
Admission: RE | Disposition: A | Discharge status: Home / Self Care | Payer: Self-pay | Source: Home / Self Care | Attending: Gastroenterology

## 2021-05-31 ENCOUNTER — Encounter: Payer: Self-pay | Admitting: Gastroenterology

## 2021-05-31 ENCOUNTER — Ambulatory Visit: Payer: Medicare Other | Admitting: Anesthesiology

## 2021-05-31 ENCOUNTER — Other Ambulatory Visit: Payer: Self-pay

## 2021-05-31 ENCOUNTER — Ambulatory Visit
Admission: RE | Admit: 2021-05-31 | Discharge: 2021-05-31 | Disposition: A | Discharge status: Home / Self Care | Payer: Medicare Other | Attending: Gastroenterology | Admitting: Gastroenterology

## 2021-05-31 DIAGNOSIS — Z86010 Personal history of colonic polyps: Secondary | ICD-10-CM | POA: Diagnosis not present

## 2021-05-31 DIAGNOSIS — K5730 Diverticulosis of large intestine without perforation or abscess without bleeding: Secondary | ICD-10-CM | POA: Diagnosis not present

## 2021-05-31 DIAGNOSIS — K641 Second degree hemorrhoids: Secondary | ICD-10-CM | POA: Insufficient documentation

## 2021-05-31 DIAGNOSIS — I10 Essential (primary) hypertension: Secondary | ICD-10-CM | POA: Insufficient documentation

## 2021-05-31 DIAGNOSIS — K219 Gastro-esophageal reflux disease without esophagitis: Secondary | ICD-10-CM | POA: Diagnosis not present

## 2021-05-31 DIAGNOSIS — K635 Polyp of colon: Secondary | ICD-10-CM | POA: Diagnosis not present

## 2021-05-31 DIAGNOSIS — D122 Benign neoplasm of ascending colon: Secondary | ICD-10-CM | POA: Insufficient documentation

## 2021-05-31 DIAGNOSIS — Z1211 Encounter for screening for malignant neoplasm of colon: Secondary | ICD-10-CM | POA: Insufficient documentation

## 2021-05-31 DIAGNOSIS — Z79899 Other long term (current) drug therapy: Secondary | ICD-10-CM | POA: Diagnosis not present

## 2021-05-31 HISTORY — PX: COLONOSCOPY WITH PROPOFOL: SHX5780

## 2021-05-31 HISTORY — PX: POLYPECTOMY: SHX5525

## 2021-05-31 SURGERY — COLONOSCOPY WITH PROPOFOL
Anesthesia: General | Site: Rectum

## 2021-05-31 MED ORDER — STERILE WATER FOR IRRIGATION IR SOLN
Status: DC | PRN
  Administered 2021-05-31: 1

## 2021-05-31 MED ORDER — LIDOCAINE HCL (CARDIAC) PF 100 MG/5ML IV SOSY
PREFILLED_SYRINGE | INTRAVENOUS | Status: DC | PRN
  Administered 2021-05-31: 50 mg via INTRAVENOUS

## 2021-05-31 MED ORDER — SODIUM CHLORIDE 0.9 % IV SOLN: INTRAVENOUS | Status: DC

## 2021-05-31 MED ORDER — LACTATED RINGERS IV SOLN: INTRAVENOUS | Status: DC

## 2021-05-31 MED ORDER — GLYCOPYRROLATE 0.2 MG/ML IJ SOLN
INTRAMUSCULAR | Status: DC | PRN
  Administered 2021-05-31: .2 mg via INTRAVENOUS

## 2021-05-31 MED ORDER — PROPOFOL 10 MG/ML IV BOLUS
INTRAVENOUS | Status: DC | PRN
  Administered 2021-05-31 (×2): 100 mg via INTRAVENOUS

## 2021-05-31 SURGICAL SUPPLY — 8 items
GOWN CVR UNV OPN BCK APRN NK (MISCELLANEOUS) ×2 IMPLANT
GOWN ISOL THUMB LOOP REG UNIV (MISCELLANEOUS) ×4
KIT PRC NS LF DISP ENDO (KITS) ×1 IMPLANT
KIT PROCEDURE OLYMPUS (KITS) ×2
MANIFOLD NEPTUNE II (INSTRUMENTS) ×3 IMPLANT
SNARE COLD EXACTO (MISCELLANEOUS) ×2 IMPLANT
TRAP ETRAP POLY (MISCELLANEOUS) ×2 IMPLANT
WATER STERILE IRR 250ML POUR (IV SOLUTION) ×3 IMPLANT

## 2021-05-31 NOTE — Transfer of Care (Signed)
Immediate Anesthesia Transfer of Care Note  Patient: Nicole Tyler  Procedure(s) Performed: COLONOSCOPY WITH BIOPSY (Rectum) POLYPECTOMY (Rectum)  Patient Location: PACU  Anesthesia Type: General  Level of Consciousness: awake, alert  and patient cooperative  Airway and Oxygen Therapy: Patient Spontanous Breathing and Patient connected to supplemental oxygen  Post-op Assessment: Post-op Vital signs reviewed, Patient's Cardiovascular Status Stable, Respiratory Function Stable, Patent Airway and No signs of Nausea or vomiting  Post-op Vital Signs: Reviewed and stable  Complications: No notable events documented.

## 2021-05-31 NOTE — H&P (Signed)
Nicole Lame, MD [address] [place], [place] [address] Phone:[phone] Fax : [phone]  Primary Care Physician:  Birdie Sons, MD Primary Gastroenterologist:  Dr. Allen Norris  Pre-Procedure History & Physical: HPI:  Nicole Tyler is a 65 y.o. female is here for an colonoscopy.   Past Medical History:  Diagnosis Date   Benign neoplasm of colon    Benign neoplasm of sigmoid colon    Hypertension     Past Surgical History:  Procedure Laterality Date   BUNIONECTOMY Right    FLEXIBLE SIGMOIDOSCOPY N/A 09/17/2015   Procedure: FLEXIBLE SIGMOIDOSCOPY;  Surgeon: Nicole Lame, MD;  Location: [place];  Service: Endoscopy;  Laterality: N/A;   FLEXIBLE SIGMOIDOSCOPY N/A 02/08/2016   Procedure: FLEXIBLE SIGMOIDOSCOPY;  Surgeon: Nicole Lame, MD;  Location: [place];  Service: Endoscopy;  Laterality: N/A;   HAND SURGERY  1984   has repair to lt hand secondary to [facility] injury caught in machine rollers.   POLYPECTOMY  09/17/2015   Procedure: POLYPECTOMY INTESTINAL;  Surgeon: Nicole Lame, MD;  Location: [place];  Service: Endoscopy;;  Sigmoid colon polyp ( several pieces from the same polyp)      Prior to Admission medications   Medication Sig Start Date End Date Taking? Authorizing Provider  amLODipine-benazepril (LOTREL) 10-40 MG capsule Take 1 capsule by mouth daily. 08/03/20  Yes Birdie Sons, MD  clobetasol ointment (TEMOVATE) 0.05 % Apply topically 2 (two) times daily. 07/25/20  Yes [provider]  Barry 300 MG/2ML SOPN Inject into the skin. 04/24/21  Yes [provider]  ketoconazole (NIZORAL) 2 % cream Apply 1 application topically daily. 05/09/20  Yes Birdie Sons, MD  predniSONE (DELTASONE) 10 MG tablet Take by mouth. 04/19/21  Yes [provider]  Sod Picosulfate-Mag Ox-Cit Acd (CLENPIQ) 10-3.5-12 MG-GM -GM/160ML SOLN Take 320 mLs by mouth as directed. 04/30/21  Yes Nicole Lame, MD  triamcinolone  ointment (KENALOG) 0.1 % Apply topically 2 (two) times daily. 07/11/20  Yes [provider]  meloxicam (MOBIC) 15 MG tablet TAKE 1 TABLET BY MOUTH EVERY DAY AS NEEDED FOR PAIN Patient not taking: Reported on 05/31/2021 03/30/20   Birdie Sons, MD  omeprazole (PRILOSEC) 20 MG capsule TAKE 1 CAPSULE BY MOUTH EVERY DAY Strength: 20 mg Patient not taking: Reported on 05/22/2021 05/20/21   Birdie Sons, MD    Allergies as of 04/30/2021   (No Known Allergies)    Family History  Problem Relation Age of Onset   Hypertension Mother    Hypertension Father    Colon cancer Father    Asthma Daughter    Breast cancer Daughter    Hypertension Maternal Grandmother    Stroke Maternal Grandmother    Breast cancer Maternal Grandmother    Hypertension Daughter     Social History   Socioeconomic History   Marital status: Married    Spouse name: Not on file   Number of children: Not on file   Years of education: Not on file   Highest education level: Not on file  Occupational History    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  Tobacco Use   Smoking status: Never   Smokeless tobacco: Never  Substance and Sexual Activity   Alcohol use: No    Alcohol/week: 0.0 standard drinks   Drug use: No   Sexual activity: Not on file  Other Topics Concern   Not on file  Social History Narrative   Not on file  Social Determinants of Health   Financial Resource Strain: Not on file  Food Insecurity: Not on file  Transportation Needs: Not on file  Physical Activity: Not on file  Stress: Not on file  Social Connections: Not on file  Intimate Partner Violence: Not on file    Review of Systems: See HPI, otherwise negative ROS  Physical Exam: BP 123/64    Pulse 83    Temp 98.5 F (36.9 C) (Temporal)    Resp (!) 22    Ht 5\' 3"  (1.6 m)    Wt 98 kg    SpO2 95%    BMI 38.26 kg/m  General:   Alert,  pleasant and cooperative in NAD Head:  Normocephalic and atraumatic. Neck:  Supple; no  masses or thyromegaly. Lungs:  Clear throughout to auscultation.    Heart:  Regular rate and rhythm. Abdomen:  Soft, nontender and nondistended. Normal bowel sounds, without guarding, and without rebound.   Neurologic:  Alert and  oriented x4;  grossly normal neurologically.  Impression/Plan: Nicole Tyler is here for an colonoscopy to be performed for a history of adenomatous polyps on 2016  Risks, benefits, limitations, and alternatives regarding  colonoscopy have been reviewed with the patient.  Questions have been answered.  All parties agreeable.   Nicole Lame, MD  05/31/2021, 8:42 AM

## 2021-05-31 NOTE — Anesthesia Procedure Notes (Signed)
Procedure Name: MAC Date/Time: 05/31/2021 9:10 AM Performed by: Jeannene Patella, CRNA Pre-anesthesia Checklist: Patient identified, Emergency Drugs available, Suction available, Timeout performed and Patient being monitored Patient Re-evaluated:Patient Re-evaluated prior to induction Oxygen Delivery Method: Nasal cannula Placement Confirmation: positive ETCO2

## 2021-05-31 NOTE — Op Note (Signed)
[facility] Gastroenterology Patient Name: Nicole Tyler Procedure Date: 05/31/2021 9:06 AM MRN: [id number] Account #: [id number] Date of Birth: 03/30/1956 Admit Type: Outpatient Age: 65 Room: [facility] OR ROOM 01 Gender: Female Note Status: Finalized Instrument Name: [id number] Procedure:             Colonoscopy Indications:           High risk colon cancer surveillance: Personal history                         of colonic polyps Providers:             Lucilla Lame MD, MD Referring MD:          Kirstie Peri. Caryn Section, MD (Referring MD) Medicines:             Propofol per Anesthesia Complications:         No immediate complications. Procedure:             Pre-Anesthesia Assessment:                        - Prior to the procedure, a History and Physical was                         performed, and patient medications and allergies were                         reviewed. The patient's tolerance of previous                         anesthesia was also reviewed. The risks and benefits                         of the procedure and the sedation options and risks                         were discussed with the patient. All questions were                         answered, and informed consent was obtained. Prior                         Anticoagulants: The patient has taken no previous                         anticoagulant or antiplatelet agents. ASA Grade                         Assessment: II - A patient with mild systemic disease.                         After reviewing the risks and benefits, the patient                         was deemed in satisfactory condition to undergo the                         procedure.  After obtaining informed consent, the colonoscope was                         passed under direct vision. Throughout the procedure,                         the patient's blood pressure, pulse, and oxygen                         saturations were monitored  continuously. The was                         introduced through the anus and advanced to the the                         cecum, identified by appendiceal orifice and ileocecal                         valve. The colonoscopy was performed without                         difficulty. The patient tolerated the procedure well.                         The quality of the bowel preparation was excellent. Findings:      The perianal and digital rectal examinations were normal.      Two sessile polyps were found in the ascending colon. The polyps were 3       to 4 mm in size. These polyps were removed with a cold snare. Resection       and retrieval were complete.      A post polypectomy scar was found in the rectum. The scar tissue with       tattoo was healthy in appearance.      A few small-mouthed diverticula were found in the entire colon.      Non-bleeding internal hemorrhoids were found during retroflexion. The       hemorrhoids were Grade II (internal hemorrhoids that prolapse but reduce       spontaneously). Impression:            - Two 3 to 4 mm polyps in the ascending colon, removed                         with a cold snare. Resected and retrieved.                        - Post-polypectomy scar in the rectum.                        - Diverticulosis in the entire examined colon.                        - Non-bleeding internal hemorrhoids. Recommendation:        - Discharge patient to home.                        - Resume previous diet.                        -  Continue present medications.                        - Await pathology results.                        - Repeat colonoscopy in 7 years for surveillance. Procedure Code(s):     --- Professional ---                        [phone], Colonoscopy, flexible; with removal of                         tumor(s), polyp(s), or other lesion(s) by snare                         technique Diagnosis Code(s):     --- Professional ---                         Z86.010, Personal history of colonic polyps                        K63.5, Polyp of colon CPT copyright 2019 American Medical Association. All rights reserved. The codes documented in this report are preliminary and upon coder review may  be revised to meet current compliance requirements. Lucilla Lame MD, MD 05/31/2021 9:27:10 AM This report has been signed electronically. Number of Addenda: 0 Note Initiated On: 05/31/2021 9:06 AM Scope Withdrawal Time: 0 hours 8 minutes 19 seconds  Total Procedure Duration: 0 hours 12 minutes 13 seconds  Estimated Blood Loss:  Estimated blood loss: none.      [facility]

## 2021-05-31 NOTE — Anesthesia Postprocedure Evaluation (Signed)
Anesthesia Post Note  Patient: Nicole Tyler  Procedure(s) Performed: COLONOSCOPY WITH BIOPSY (Rectum) POLYPECTOMY (Rectum)     Patient location during evaluation: PACU Anesthesia Type: General Level of consciousness: awake and alert Pain management: pain level controlled Vital Signs Assessment: vitals unstable Respiratory status: spontaneous breathing and nonlabored ventilation Postop Assessment: no apparent nausea or vomiting Anesthetic complications: no   No notable events documented.  Carrin Vannostrand [employer]

## 2021-05-31 NOTE — Anesthesia Preprocedure Evaluation (Signed)
Anesthesia Evaluation  Patient identified by MRN, date of birth, ID band Patient awake    Reviewed: Allergy & Precautions, NPO status , Patient's Chart, lab work & pertinent test results  Airway Mallampati: II  TM Distance: >3 FB Neck ROM: Full    Dental no notable dental hx.    Pulmonary    Pulmonary exam normal        Cardiovascular hypertension, Pt. on medications Normal cardiovascular exam     Neuro/Psych  Neuromuscular disease (radiculopathy) negative psych ROS   GI/Hepatic GERD  Controlled,Hx of colon polyps    Endo/Other  BMI 38  Renal/GU negative Renal ROS     Musculoskeletal negative musculoskeletal ROS (+)   Abdominal (+) + obese,   Peds  Hematology negative hematology ROS (+)   Anesthesia Other Findings   Reproductive/Obstetrics                             Anesthesia Physical Anesthesia Plan  ASA: 2  Anesthesia Plan: General   Post-op Pain Management: Minimal or no pain anticipated   Induction: Intravenous  PONV Risk Score and Plan: 3 and Propofol infusion, TIVA and Treatment may vary due to age or medical condition  Airway Management Planned: Natural Airway and Nasal Cannula  Additional Equipment: None  Intra-op Plan:   Post-operative Plan:   Informed Consent: I have reviewed the patients History and Physical, chart, labs and discussed the procedure including the risks, benefits and alternatives for the proposed anesthesia with the patient or authorized representative who has indicated his/her understanding and acceptance.     Dental advisory given  Plan Discussed with: CRNA  Anesthesia Plan Comments:         Anesthesia Quick Evaluation

## 2021-06-04 ENCOUNTER — Encounter: Payer: Self-pay | Admitting: Gastroenterology

## 2021-06-04 LAB — SURGICAL PATHOLOGY

## 2021-07-23 DIAGNOSIS — R21 Rash and other nonspecific skin eruption: Secondary | ICD-10-CM | POA: Diagnosis not present

## 2021-08-09 DIAGNOSIS — R21 Rash and other nonspecific skin eruption: Secondary | ICD-10-CM | POA: Diagnosis not present

## 2021-08-09 DIAGNOSIS — D485 Neoplasm of uncertain behavior of skin: Secondary | ICD-10-CM | POA: Diagnosis not present

## 2021-08-09 DIAGNOSIS — L989 Disorder of the skin and subcutaneous tissue, unspecified: Secondary | ICD-10-CM | POA: Diagnosis not present

## 2021-08-09 DIAGNOSIS — L119 Acantholytic disorder, unspecified: Secondary | ICD-10-CM | POA: Diagnosis not present

## 2021-08-16 DIAGNOSIS — L102 Pemphigus foliaceous: Secondary | ICD-10-CM | POA: Diagnosis not present

## 2021-08-22 DIAGNOSIS — Z79899 Other long term (current) drug therapy: Secondary | ICD-10-CM | POA: Diagnosis not present

## 2021-08-22 DIAGNOSIS — L102 Pemphigus foliaceous: Secondary | ICD-10-CM | POA: Diagnosis not present

## 2021-08-30 ENCOUNTER — Encounter: Payer: Self-pay | Admitting: Family Medicine

## 2021-08-30 ENCOUNTER — Other Ambulatory Visit: Payer: Self-pay

## 2021-08-30 ENCOUNTER — Ambulatory Visit (INDEPENDENT_AMBULATORY_CARE_PROVIDER_SITE_OTHER): Payer: No Typology Code available for payment source | Admitting: Family Medicine

## 2021-08-30 VITALS — BP 129/60 | HR 83 | Temp 98.2°F | Resp 16

## 2021-08-30 DIAGNOSIS — I10 Essential (primary) hypertension: Secondary | ICD-10-CM

## 2021-08-30 DIAGNOSIS — E2839 Other primary ovarian failure: Secondary | ICD-10-CM | POA: Diagnosis not present

## 2021-08-30 DIAGNOSIS — H109 Unspecified conjunctivitis: Secondary | ICD-10-CM | POA: Diagnosis not present

## 2021-08-30 DIAGNOSIS — L102 Pemphigus foliaceous: Secondary | ICD-10-CM | POA: Diagnosis not present

## 2021-08-30 DIAGNOSIS — Z23 Encounter for immunization: Secondary | ICD-10-CM | POA: Diagnosis not present

## 2021-08-30 MED ORDER — CIPROFLOXACIN HCL 0.3 % OP SOLN: 1.0000 [drp] | OPHTHALMIC | 0 refills | Status: DC

## 2021-08-30 NOTE — Progress Notes (Signed)
?  ? ?I,Nicole Tyler,acting as a scribe for Lelon Huh, MD.,have documented all relevant documentation on the behalf of Lelon Huh, MD,as directed by  Lelon Huh, MD while in the presence of Lelon Huh, MD. ? ? ?Established patient visit ? ? ?Patient: Nicole Tyler   DOB: 07-05-1955   66 y.o. Female  MRN: [id number] ?Visit Date: 08/30/2021 ? ?Today's healthcare provider: Lelon Huh, MD  ? ?Chief Complaint  ?Patient presents with  ? Hypertension  ? ?Subjective  ?  ?HPI  ?Hypertension, follow-up ? ?BP Readings from Last 3 Encounters:  ?05/31/21 108/61  ?04/30/21 (!) 143/76  ?08/03/20 139/76  ? Wt Readings from Last 3 Encounters:  ?05/31/21 216 lb (98 kg)  ?04/30/21 219 lb (99.3 kg)  ?08/03/20 221 lb 12.8 oz (100.6 kg)  ?  ? ?She was last seen for hypertension 1 years ago.  ?BP at that visit was 139/76. Management since that visit includes continue current medications. ? ?She reports excellent compliance with treatment. ?She is not having side effects.  ?She is following a Regular diet. ?She is not exercising. ?She does not smoke. ? ?Use of agents associated with hypertension: none.  ? ?Outside blood pressures are stable. ?Symptoms: ?No chest pain No chest pressure  ?No palpitations No syncope  ?No dyspnea No orthopnea  ?No paroxysmal nocturnal dyspnea No lower extremity edema  ? ?Pertinent labs: ?Lab Results  ?Component Value Date  ? CHOL 201 (H) 01/30/2020  ? HDL 56 01/30/2020  ? LDLCALC 130 (H) 01/30/2020  ? TRIG 81 01/30/2020  ? CHOLHDL 3.6 01/30/2020  ? Lab Results  ?Component Value Date  ? NA 141 01/30/2020  ? K 3.8 01/30/2020  ? CREATININE 0.84 01/30/2020  ? GFRNONAA 74 01/30/2020  ? GLUCOSE 87 01/30/2020  ? TSH 2.31 06/18/2010  ?  ? ?The 10-year ASCVD risk score (Arnett DK, et al., 2019) is: 6.6%  ? ?---------------------------------------------------------------------------------------------------  ?She also reports waking up yesterday with both eyes red and itchy. Has has some trouble with  allergies, but no recent cold symptoms. Has improvement significantly today.  ? ? ?Medications: ?Outpatient Medications Prior to Visit  ?Medication Sig  ? amLODipine-benazepril (LOTREL) 10-40 MG capsule Take 1 capsule by mouth daily.  ? clobetasol ointment (TEMOVATE) 0.05 % Apply topically 2 (two) times daily.  ? DUPIXENT 300 MG/2ML SOPN Inject into the skin.  ? ketoconazole (NIZORAL) 2 % cream Apply 1 application topically daily.  ? predniSONE (DELTASONE) 10 MG tablet Take by mouth.  ? Sod Picosulfate-Mag Ox-Cit Acd (CLENPIQ) 10-3.5-12 MG-GM -GM/160ML SOLN Take 320 mLs by mouth as directed.  ? triamcinolone ointment (KENALOG) 0.1 % Apply topically 2 (two) times daily.  ? meloxicam (MOBIC) 15 MG tablet TAKE 1 TABLET BY MOUTH EVERY DAY AS NEEDED FOR PAIN (Patient not taking: Reported on 05/31/2021)  ? omeprazole (PRILOSEC) 20 MG capsule TAKE 1 CAPSULE BY MOUTH EVERY DAY Strength: 20 mg (Patient not taking: Reported on 05/22/2021)  ? ?No facility-administered medications prior to visit.  ? ? ?Review of Systems ? ? ?  Objective  ?  ?BP 129/60 (BP Location: Right Arm, Patient Position: Sitting, Cuff Size: Large)   Pulse 83   Temp 98.2 ?F (36.8 ?C) (Oral)   Resp 16  ? ? ?Physical Exam  ? ? ?General: Appearance:    Obese female in no acute distress  ?Eyes:    PERRL, conjunctiva/corneas clear, EOM's intact       ?Lungs:     Clear to auscultation bilaterally, respirations  unlabored  ?Heart:    Normal heart rate. Normal rhythm. No murmurs, rubs, or gallops.    ?MS:   All extremities are intact.    ?Neurologic:   Awake, alert, oriented x 3. No apparent focal neurological defect.   ?   ?  ? Assessment & Plan  ?  ? ?1. Estrogen deficiency ? ?- DG Bone density [place]; Future ? ?2. Essential (primary) hypertension ?Well controlled.  Continue current medications.   ?- CBC ?- Comprehensive metabolic panel ?- Lipid panel ? ?3. Conjunctivitis of both eyes, unspecified conjunctivitis type ?x 2 days with subjective improvement  today. Likely viral conjunctivitis. Given prescription ciprofloxacin (CILOXAN) 0.3 % ophthalmic solution; Place 1 drop into both eyes every 4 (four) hours while awake. To fill only if not continue to steadily improve.  ? ?4. Need for influenza vaccination ? ?- Flu Vaccine QUAD High Dose(Fluad) ? ?   ? ?The entirety of the information documented in the History of Present Illness, Review of Systems and Physical Exam were personally obtained by me. Portions of this information were initially documented by the CMA and reviewed by me for thoroughness and accuracy.   ? ? ?Lelon Huh, MD  ?[facility] ?[phone] (phone) ?[phone] (fax) ? ?[facility]  ?

## 2021-08-31 LAB — CBC
Hematocrit: 47.1 % — ABNORMAL HIGH (ref 34.0–46.6)
Hemoglobin: 15.4 g/dL (ref 11.1–15.9)
MCH: 29.5 pg (ref 26.6–33.0)
MCHC: 32.7 g/dL (ref 31.5–35.7)
MCV: 90 fL (ref 79–97)
Platelets: 205 10*3/uL (ref 150–450)
RBC: 5.22 x10E6/uL (ref 3.77–5.28)
RDW: 13.7 % (ref 11.7–15.4)
WBC: 10.7 10*3/uL (ref 3.4–10.8)

## 2021-08-31 LAB — COMPREHENSIVE METABOLIC PANEL
ALT: 28 IU/L (ref 0–32)
AST: 20 IU/L (ref 0–40)
Albumin/Globulin Ratio: 1.3 (ref 1.2–2.2)
Albumin: 4.2 g/dL (ref 3.8–4.8)
Alkaline Phosphatase: 64 IU/L (ref 44–121)
BUN/Creatinine Ratio: 13 (ref 12–28)
BUN: 14 mg/dL (ref 8–27)
Bilirubin Total: 0.5 mg/dL (ref 0.0–1.2)
CO2: 31 mmol/L — ABNORMAL HIGH (ref 20–29)
Calcium: 9.8 mg/dL (ref 8.7–10.3)
Chloride: 98 mmol/L (ref 96–106)
Creatinine, Ser: 1.04 mg/dL — ABNORMAL HIGH (ref 0.57–1.00)
Globulin, Total: 3.3 g/dL (ref 1.5–4.5)
Glucose: 100 mg/dL — ABNORMAL HIGH (ref 70–99)
Potassium: 3.5 mmol/L (ref 3.5–5.2)
Sodium: 143 mmol/L (ref 134–144)
Total Protein: 7.5 g/dL (ref 6.0–8.5)
eGFR: 59 mL/min/{1.73_m2} — ABNORMAL LOW (ref >59)

## 2021-08-31 LAB — LIPID PANEL
Chol/HDL Ratio: 3.2 ratio (ref 0.0–4.4)
Cholesterol, Total: 240 mg/dL — ABNORMAL HIGH (ref 100–199)
HDL: 76 mg/dL (ref >39)
LDL Chol Calc (NIH): 137 mg/dL — ABNORMAL HIGH (ref 0–99)
Triglycerides: 152 mg/dL — ABNORMAL HIGH (ref 0–149)
VLDL Cholesterol Cal: 27 mg/dL (ref 5–40)

## 2021-09-02 ENCOUNTER — Telehealth: Payer: Self-pay

## 2021-09-02 DIAGNOSIS — I10 Essential (primary) hypertension: Secondary | ICD-10-CM

## 2021-09-02 MED ORDER — AMLODIPINE BESY-BENAZEPRIL HCL 10-40 MG PO CAPS: 1.0000 | ORAL_CAPSULE | Freq: Every day | ORAL | 3 refills | Status: DC

## 2021-09-02 MED ORDER — PRAVASTATIN SODIUM 20 MG PO TABS: 20.0000 mg | ORAL_TABLET | Freq: Every day | ORAL | 3 refills | Status: DC

## 2021-09-02 NOTE — Telephone Encounter (Signed)
-----   Message from Birdie Sons, MD sent at 08/31/2021  9:06 AM EDT ----- ?Cholesterol is up to 240. Should be under 200. Need to start pravastatin '20mg'$  one tablet daily, #30, rf x 3.  ?Rest of labs are good. Continue current medications.  Schedule follow up for cholesterol in 3 months.  ?

## 2021-09-02 NOTE — Telephone Encounter (Signed)
Patient aware. Prescription for cholesterol medicine sent to pharmacy and a refill for BP medicine. ?

## 2021-09-10 ENCOUNTER — Ambulatory Visit (INDEPENDENT_AMBULATORY_CARE_PROVIDER_SITE_OTHER): Payer: No Typology Code available for payment source

## 2021-09-10 VITALS — Wt 216.0 lb

## 2021-09-10 DIAGNOSIS — Z0000 Encounter for general adult medical examination without abnormal findings: Secondary | ICD-10-CM

## 2021-09-10 DIAGNOSIS — Z1231 Encounter for screening mammogram for malignant neoplasm of breast: Secondary | ICD-10-CM | POA: Diagnosis not present

## 2021-09-10 NOTE — Progress Notes (Signed)
?Virtual Visit via Telephone Note ? ?I connected with  Nicole Tyler on 09/10/21 at  2:15 PM EDT by telephone and verified that I am speaking with the correct person using two identifiers. ? ?Location: ?Patient: home ?Provider: [facility] ?Persons participating in the virtual visit: patient/Nurse Health Advisor ?  ?I discussed the limitations, risks, security and privacy concerns of performing an evaluation and management service by telephone and the availability of in person appointments. The patient expressed understanding and agreed to proceed. ? ?Interactive audio and video telecommunications were attempted between this nurse and patient, however failed, due to patient having technical difficulties OR patient did not have access to video capability.  We continued and completed visit with audio only. ? ?Some vital signs may be absent or patient reported.  ? ?Dionisio David, LPN ? ?Subjective:  ? Nicole Tyler is a 66 y.o. female who presents for an Initial Medicare Annual Wellness Visit. ? ?Review of Systems    ? ?  ? ?   ?Objective:  ?  ?There were no vitals filed for this visit. ?There is no height or weight on file to calculate BMI. ? ? ?  02/08/2016  ?  7:00 AM 09/17/2015  ? 10:05 AM  ?Advanced Directives  ?Does Patient Have a Medical Advance Directive? No No  ?Would patient like information on creating a medical advance directive? No - patient declined information No - patient declined information  ? ? ?Current Medications (verified) ?Outpatient Encounter Medications as of 09/10/2021  ?Medication Sig  ? amLODipine-benazepril (LOTREL) 10-40 MG capsule Take 1 capsule by mouth daily.  ? ciprofloxacin (CILOXAN) 0.3 % ophthalmic solution Place 1 drop into both eyes every 4 (four) hours while awake.  ? clobetasol ointment (TEMOVATE) 0.05 % Apply topically 2 (two) times daily.  ? folic acid (FOLVITE) 1 MG tablet Take 1 mg by mouth daily.  ? methotrexate (RHEUMATREX) 5 MG tablet Take 5 mg by mouth once a week. Caution:  Chemotherapy. Protect from light.  ? omeprazole (PRILOSEC) 20 MG capsule TAKE 1 CAPSULE BY MOUTH EVERY DAY Strength: 20 mg (Patient not taking: Reported on 05/22/2021)  ? pravastatin (PRAVACHOL) 20 MG tablet Take 1 tablet (20 mg total) by mouth daily.  ? predniSONE (DELTASONE) 10 MG tablet Take by mouth.  ? triamcinolone ointment (KENALOG) 0.1 % Apply topically 2 (two) times daily.  ? ?No facility-administered encounter medications on file as of 09/10/2021.  ? ? ?Allergies (verified) ?Patient has no known allergies.  ? ?History: ?Past Medical History:  ?Diagnosis Date  ? Benign neoplasm of colon   ? Benign neoplasm of sigmoid colon   ? Hypertension   ? ?Past Surgical History:  ?Procedure Laterality Date  ? BUNIONECTOMY Right   ? COLONOSCOPY WITH PROPOFOL N/A 05/31/2021  ? Procedure: COLONOSCOPY WITH BIOPSY;  Surgeon: Lucilla Lame, MD;  Location: [place];  Service: Endoscopy;  Laterality: N/A;  ? FLEXIBLE SIGMOIDOSCOPY N/A 09/17/2015  ? Procedure: FLEXIBLE SIGMOIDOSCOPY;  Surgeon: Lucilla Lame, MD;  Location: [place];  Service: Endoscopy;  Laterality: N/A;  ? FLEXIBLE SIGMOIDOSCOPY N/A 02/08/2016  ? Procedure: FLEXIBLE SIGMOIDOSCOPY;  Surgeon: Lucilla Lame, MD;  Location: [place];  Service: Endoscopy;  Laterality: N/A;  ? HAND SURGERY  1984  ? has repair to lt hand secondary to WC injury caught in machine rollers.  ? POLYPECTOMY  09/17/2015  ? Procedure: POLYPECTOMY INTESTINAL;  Surgeon: Lucilla Lame, MD;  Location: [place];  Service: Endoscopy;;  Sigmoid colon polyp (  several pieces from the same polyp)  ?  ? POLYPECTOMY N/A 05/31/2021  ? Procedure: POLYPECTOMY;  Surgeon: Lucilla Lame, MD;  Location: [place];  Service: Endoscopy;  Laterality: N/A;  ? ?Family History  ?Problem Relation Age of Onset  ? Hypertension Mother   ? Hypertension Father   ? Colon cancer Father   ? Asthma Daughter   ? Breast cancer Daughter   ? Hypertension Maternal Grandmother   ? Stroke  Maternal Grandmother   ? Breast cancer Maternal Grandmother   ? Hypertension Daughter   ? ?Social History  ? ?Socioeconomic History  ? Marital status: Married  ?  Spouse name: Not on file  ? Number of children: Not on file  ? Years of education: Not on file  ? Highest education level: Not on file  ?Occupational History  ?  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 Smoking status: Never  ? Smokeless tobacco: Never  ?Substance and Sexual Activity  ? Alcohol use: No  ?  Alcohol/week: 0.0 standard drinks  ? Drug use: No  ? Sexual activity: Not on file  ?Other Topics Concern  ? Not on file  ?Social History Narrative  ? Not on file  ? ?Social Determinants of Health  ? ?Financial Resource Strain: Not on file  ?Food Insecurity: Not on file  ?Transportation Needs: Not on file  ?Physical Activity: Not on file  ?Stress: Not on file  ?Social Connections: Not on file  ? ? ?Tobacco Counseling ?Counseling given: Not Answered ? ? ?Clinical Intake: ? ?Pre-visit preparation completed: Yes ? ?Pain : No/denies pain ? ?  ? ?Nutritional Risks: None ?Diabetes: No ? ?How often do you need to have someone help you when you read instructions, pamphlets, or other written materials from your doctor or pharmacy?: 1 - Never ? ?Diabetic?no ? ?Interpreter Needed?: No ? ?Information entered by :: Kirke Shaggy, LPN ? ? ?Activities of Daily Living ? ?  09/09/2021  ?  3:23 PM 08/30/2021  ?  1:36 PM  ?In your present state of health, do you have any difficulty performing the following activities:  ?Hearing? 0 0  ?Vision? 0 0  ?Difficulty concentrating or making decisions? 0 0  ?Walking or climbing stairs? 0 0  ?Dressing or bathing? 0 0  ?Doing errands, shopping? 0 0  ?Preparing Food and eating ? N   ?Using the Toilet? N   ?In the past six months, have you accidently leaked urine? N   ?Do you have problems with loss of bowel control? N   ?Managing your Medications? N   ?Managing your Finances? N   ?Housekeeping or managing your  Housekeeping? N   ? ? ?Patient Care Team: ?Birdie Sons, MD as PCP - General (Family Medicine) ?Rory Percy, MD as Referring Physician (Dermatology) ?Lucilla Lame, MD as Consulting Physician (Gastroenterology) ? ?Indicate any recent Medical Services you may have received from other than [facility] providers in the past year (date may be approximate). ? ?   ?Assessment:  ? This is a routine wellness examination for Nicole Tyler. ? ?Hearing/Vision screen ?No results found. ? ?Dietary issues and exercise activities discussed: ?  ? ? Goals Addressed   ?None ?  ? ?Depression Screen ? ?  08/30/2021  ?  1:36 PM 05/09/2020  ?  8:43 AM 05/26/2017  ?  8:56 AM 08/27/2015  ? 10:24 AM  ?PHQ 2/9 Scores  ?PHQ - 2 Score 0 0 0 0  ?PHQ- 9 Score  0 2  2  ?  ?Fall Risk ? ?  09/09/2021  ?  3:23 PM 08/30/2021  ?  1:37 PM 05/09/2020  ?  8:44 AM 05/09/2020  ?  8:43 AM  ?Fall Risk   ?Falls in the past year? 0 0  0  ?Number falls in past yr: 0 0  0  ?Injury with Fall? 0 0  0  ?Risk for fall due to :  No Fall Risks No Fall Risks No Fall Risks  ?Follow up  Falls evaluation completed Falls evaluation completed Falls evaluation completed  ? ? ?FALL RISK PREVENTION PERTAINING TO THE HOME: ? ?Any stairs in or around the home? No  ?If so, are there any without handrails? No  ?Home free of loose throw rugs in walkways, pet beds, electrical cords, etc? Yes  ?Adequate lighting in your home to reduce risk of falls? Yes  ? ?ASSISTIVE DEVICES UTILIZED TO PREVENT FALLS: ? ?Life alert? No  ?Use of a cane, walker or w/c? No  ?Grab bars in the bathroom? Yes  ?Shower chair or bench in shower? Yes  ?Elevated toilet seat or a handicapped toilet? Yes  ?Cognitive Function: ?  ?  ?  ? ?Immunizations ?Immunization History  ?Administered Date(s) Administered  ? Fluad Quad(high Dose 65+) 08/30/2021  ? Influenza,inj,Quad PF,6+ Mos 02/19/2016, 05/26/2017, 03/14/2019, 02/22/2020  ? Moderna Sars-Covid-2 Vaccination 05/07/2020  ? PFIZER(Purple Top)SARS-COV-2 Vaccination 09/13/2019,  10/04/2019  ? Pneumococcal Polysaccharide-23 08/03/2020  ? Tdap 08/16/2014  ? Zoster, Live 08/27/2015  ? ? ?TDAP status: Up to date ? ?Flu Vaccine status: Up to date ? ?Pneumococcal vaccine status: Up to date ? ?Covid-19

## 2021-09-10 NOTE — Patient Instructions (Addendum)
Nicole Tyler , ?Thank you for taking time to come for your Medicare Wellness Visit. I appreciate your ongoing commitment to your health goals. Please review the following plan we discussed and let me know if I can assist you in the future.  ? ?Screening recommendations/referrals: ?Colonoscopy: 05/31/21 ?Mammogram: 06/26/20, referral sent ?Bone Density: has appt 10/30/21 ?Recommended yearly ophthalmology/optometry visit for glaucoma screening and checkup ?Recommended yearly dental visit for hygiene and checkup ? ?Vaccinations: ?Influenza vaccine: 08/30/21 ?Pneumococcal vaccine: 08/03/20 ?Tdap vaccine: 08/16/14 ?Shingles vaccine: Zostavax 08/27/15   ?Covid-19:09/13/19, 10/04/19, 05/07/20 ? ?Advanced directives: no ? ?Conditions/risks identified: none ? ?Next appointment: Follow up in one year for your annual wellness visit - 09/16/22 @ 1:30pm by phone ? ? ?Preventive Care 9 Years and Older, Female ?Preventive care refers to lifestyle choices and visits with your health care provider that can promote health and wellness. ?What does preventive care include? ?A yearly physical exam. This is also called an annual well check. ?Dental exams once or twice a year. ?Routine eye exams. Ask your health care provider how often you should have your eyes checked. ?Personal lifestyle choices, including: ?Daily care of your teeth and gums. ?Regular physical activity. ?Eating a healthy diet. ?Avoiding tobacco and drug use. ?Limiting alcohol use. ?Practicing safe sex. ?Taking low-dose aspirin every day. ?Taking vitamin and mineral supplements as recommended by your health care provider. ?What happens during an annual well check? ?The services and screenings done by your health care provider during your annual well check will depend on your age, overall health, lifestyle risk factors, and family history of disease. ?Counseling  ?Your health care provider may ask you questions about your: ?Alcohol use. ?Tobacco use. ?Drug use. ?Emotional  well-being. ?Home and relationship well-being. ?Sexual activity. ?Eating habits. ?History of falls. ?Memory and ability to understand (cognition). ?Work and work Statistician. ?Reproductive health. ?Screening  ?You may have the following tests or measurements: ?Height, weight, and BMI. ?Blood pressure. ?Lipid and cholesterol levels. These may be checked every 5 years, or more frequently if you are over 75 years old. ?Skin check. ?Lung cancer screening. You may have this screening every year starting at age 43 if you have a 30-pack-year history of smoking and currently smoke or have quit within the past 15 years. ?Fecal occult blood test (FOBT) of the stool. You may have this test every year starting at age 31. ?Flexible sigmoidoscopy or colonoscopy. You may have a sigmoidoscopy every 5 years or a colonoscopy every 10 years starting at age 13. ?Hepatitis C blood test. ?Hepatitis B blood test. ?Sexually transmitted disease (STD) testing. ?Diabetes screening. This is done by checking your blood sugar (glucose) after you have not eaten for a while (fasting). You may have this done every 1-3 years. ?Bone density scan. This is done to screen for osteoporosis. You may have this done starting at age 44. ?Mammogram. This may be done every 1-2 years. Talk to your health care provider about how often you should have regular mammograms. ?Talk with your health care provider about your test results, treatment options, and if necessary, the need for more tests. ?Vaccines  ?Your health care provider may recommend certain vaccines, such as: ?Influenza vaccine. This is recommended every year. ?Tetanus, diphtheria, and acellular pertussis (Tdap, Td) vaccine. You may need a Td booster every 10 years. ?Zoster vaccine. You may need this after age 62. ?Pneumococcal 13-valent conjugate (PCV13) vaccine. One dose is recommended after age 19. ?Pneumococcal polysaccharide (PPSV23) vaccine. One dose is recommended after age 69. ?  Talk to your  health care provider about which screenings and vaccines you need and how often you need them. ?This information is not intended to replace advice given to you by your health care provider. Make sure you discuss any questions you have with your health care provider. ?Document Released: 06/22/2015 Document Revised: 02/13/2016 Document Reviewed: 03/27/2015 ?Elsevier Interactive Patient Education ? 2017 [place]. ? ?Fall Prevention in the Home ?Falls can cause injuries. They can happen to people of all ages. There are many things you can do to make your home safe and to help prevent falls. ?What can I do on the outside of my home? ?Regularly fix the edges of walkways and driveways and fix any cracks. ?Remove anything that might make you trip as you walk through a door, such as a raised step or threshold. ?Trim any bushes or trees on the path to your home. ?Use bright outdoor lighting. ?Clear any walking paths of anything that might make someone trip, such as rocks or tools. ?Regularly check to see if handrails are loose or broken. Make sure that both sides of any steps have handrails. ?Any raised decks and porches should have guardrails on the edges. ?Have any leaves, snow, or ice cleared regularly. ?Use sand or salt on walking paths during winter. ?Clean up any spills in your garage right away. This includes oil or grease spills. ?What can I do in the bathroom? ?Use night lights. ?Install grab bars by the toilet and in the tub and shower. Do not use towel bars as grab bars. ?Use non-skid mats or decals in the tub or shower. ?If you need to sit down in the shower, use a plastic, non-slip stool. ?Keep the floor dry. Clean up any water that spills on the floor as soon as it happens. ?Remove soap buildup in the tub or shower regularly. ?Attach bath mats securely with double-sided non-slip rug tape. ?Do not have throw rugs and other things on the floor that can make you trip. ?What can I do in the bedroom? ?Use night  lights. ?Make sure that you have a light by your bed that is easy to reach. ?Do not use any sheets or blankets that are too big for your bed. They should not hang down onto the floor. ?Have a firm chair that has side arms. You can use this for support while you get dressed. ?Do not have throw rugs and other things on the floor that can make you trip. ?What can I do in the kitchen? ?Clean up any spills right away. ?Avoid walking on wet floors. ?Keep items that you use a lot in easy-to-reach places. ?If you need to reach something above you, use a strong step stool that has a grab bar. ?Keep electrical cords out of the way. ?Do not use floor polish or wax that makes floors slippery. If you must use wax, use non-skid floor wax. ?Do not have throw rugs and other things on the floor that can make you trip. ?What can I do with my stairs? ?Do not leave any items on the stairs. ?Make sure that there are handrails on both sides of the stairs and use them. Fix handrails that are broken or loose. Make sure that handrails are as long as the stairways. ?Check any carpeting to make sure that it is firmly attached to the stairs. Fix any carpet that is loose or worn. ?Avoid having throw rugs at the top or bottom of the stairs. If you do have throw  rugs, attach them to the floor with carpet tape. ?Make sure that you have a light switch at the top of the stairs and the bottom of the stairs. If you do not have them, ask someone to add them for you. ?What else can I do to help prevent falls? ?Wear shoes that: ?Do not have high heels. ?Have rubber bottoms. ?Are comfortable and fit you well. ?Are closed at the toe. Do not wear sandals. ?If you use a stepladder: ?Make sure that it is fully opened. Do not climb a closed stepladder. ?Make sure that both sides of the stepladder are locked into place. ?Ask someone to hold it for you, if possible. ?Clearly mark and make sure that you can see: ?Any grab bars or handrails. ?First and last  steps. ?Where the edge of each step is. ?Use tools that help you move around (mobility aids) if they are needed. These include: ?Canes. ?Walkers. ?Scooters. ?Crutches. ?Turn on the lights when you go into a dark area. Repl

## 2021-09-13 DIAGNOSIS — Z79899 Other long term (current) drug therapy: Secondary | ICD-10-CM | POA: Diagnosis not present

## 2021-09-13 DIAGNOSIS — L102 Pemphigus foliaceous: Secondary | ICD-10-CM | POA: Diagnosis not present

## 2021-09-17 DIAGNOSIS — H5213 Myopia, bilateral: Secondary | ICD-10-CM | POA: Diagnosis not present

## 2021-09-17 DIAGNOSIS — Z0100 Encounter for examination of eyes and vision without abnormal findings: Secondary | ICD-10-CM | POA: Diagnosis not present

## 2021-10-11 DIAGNOSIS — L102 Pemphigus foliaceous: Secondary | ICD-10-CM | POA: Diagnosis not present

## 2021-10-11 DIAGNOSIS — Z79899 Other long term (current) drug therapy: Secondary | ICD-10-CM | POA: Diagnosis not present

## 2021-10-18 DIAGNOSIS — Z79899 Other long term (current) drug therapy: Secondary | ICD-10-CM | POA: Diagnosis not present

## 2021-10-30 ENCOUNTER — Ambulatory Visit
Admission: RE | Admit: 2021-10-30 | Discharge: 2021-10-30 | Disposition: A | Discharge status: Home / Self Care | Payer: No Typology Code available for payment source | Source: Ambulatory Visit | Attending: Family Medicine | Admitting: Family Medicine

## 2021-10-30 DIAGNOSIS — Z780 Asymptomatic menopausal state: Secondary | ICD-10-CM | POA: Diagnosis not present

## 2021-10-30 DIAGNOSIS — M85851 Other specified disorders of bone density and structure, right thigh: Secondary | ICD-10-CM | POA: Diagnosis not present

## 2021-10-30 DIAGNOSIS — E2839 Other primary ovarian failure: Secondary | ICD-10-CM | POA: Diagnosis not present

## 2021-11-08 DIAGNOSIS — Z79899 Other long term (current) drug therapy: Secondary | ICD-10-CM | POA: Diagnosis not present

## 2021-11-08 DIAGNOSIS — L102 Pemphigus foliaceous: Secondary | ICD-10-CM | POA: Diagnosis not present

## 2021-12-17 DIAGNOSIS — L102 Pemphigus foliaceous: Secondary | ICD-10-CM | POA: Diagnosis not present

## 2021-12-20 DIAGNOSIS — L102 Pemphigus foliaceous: Secondary | ICD-10-CM | POA: Diagnosis not present

## 2021-12-20 DIAGNOSIS — Z79899 Other long term (current) drug therapy: Secondary | ICD-10-CM | POA: Diagnosis not present

## 2021-12-23 DIAGNOSIS — Z7689 Persons encountering health services in other specified circumstances: Secondary | ICD-10-CM | POA: Diagnosis not present

## 2021-12-25 ENCOUNTER — Telehealth: Payer: Self-pay | Admitting: *Deleted

## 2021-12-25 DIAGNOSIS — K219 Gastro-esophageal reflux disease without esophagitis: Secondary | ICD-10-CM

## 2021-12-25 NOTE — Telephone Encounter (Signed)
[facility] is calling to request medication RF for patient: Pravastatin 20 mg

## 2021-12-26 MED ORDER — PRAVASTATIN SODIUM 20 MG PO TABS: 20.0000 mg | ORAL_TABLET | Freq: Every day | ORAL | 3 refills | Status: DC

## 2021-12-26 NOTE — Telephone Encounter (Signed)
Patient is due repeat labs for cholesterol- call to patient and left message to come by lab for recheck- needs order. Message sent to office for order.

## 2021-12-26 NOTE — Telephone Encounter (Signed)
Requested Prescriptions  Pending Prescriptions Disp Refills  . pravastatin (PRAVACHOL) 20 MG tablet 30 tablet 3    Sig: Take 1 tablet (20 mg total) by mouth daily.     Cardiovascular:  Antilipid - Statins Failed - 12/25/2021  4:23 PM      Failed - Lipid Panel in normal range within the last 12 months    Cholesterol, Total  Date Value Ref Range Status  08/30/2021 240 (H) 100 - 199 mg/dL Final   LDL Chol Calc (NIH)  Date Value Ref Range Status  08/30/2021 137 (H) 0 - 99 mg/dL Final   HDL  Date Value Ref Range Status  08/30/2021 76 >39 mg/dL Final   Triglycerides  Date Value Ref Range Status  08/30/2021 152 (H) 0 - 149 mg/dL Final         Passed - Patient is not pregnant      Passed - Valid encounter within last 12 months    Recent Outpatient Visits          3 months ago Need for influenza vaccination   [facility] Birdie Sons, MD   1 year ago Essential (primary) hypertension   [facility] Birdie Sons, MD   1 year ago Dermatitis   [place], Vickki Muff, PA-C   1 year ago Tinea corporis   [employer], Vickki Muff, PA-C   1 year ago Tinea corporis   [place], Kirstie Peri, MD

## 2021-12-27 NOTE — Telephone Encounter (Addendum)
Pt states that she had a urine test at her dermatologist last week and it showed early signs of diabetes.  She states that they told her that she needed to contact her PCP and get him to order labs to check further.  She states that they were supposed to be sending that information to Dr. Caryn Section.  She would like to come Monday to have this done along with the lipids that is in previous message but was not ordered.  Please advise

## 2021-12-30 ENCOUNTER — Other Ambulatory Visit: Payer: Self-pay | Admitting: Family Medicine

## 2021-12-30 DIAGNOSIS — R739 Hyperglycemia, unspecified: Secondary | ICD-10-CM | POA: Diagnosis not present

## 2021-12-30 DIAGNOSIS — E782 Mixed hyperlipidemia: Secondary | ICD-10-CM

## 2021-12-30 DIAGNOSIS — I10 Essential (primary) hypertension: Secondary | ICD-10-CM

## 2021-12-30 NOTE — Telephone Encounter (Signed)
Pt.notified

## 2021-12-31 LAB — COMPREHENSIVE METABOLIC PANEL
ALT: 11 IU/L (ref 0–32)
AST: 16 IU/L (ref 0–40)
Albumin/Globulin Ratio: 1.5 (ref 1.2–2.2)
Albumin: 4.1 g/dL (ref 3.9–4.9)
Alkaline Phosphatase: 51 IU/L (ref 44–121)
BUN/Creatinine Ratio: 11 — ABNORMAL LOW (ref 12–28)
BUN: 12 mg/dL (ref 8–27)
Bilirubin Total: 0.6 mg/dL (ref 0.0–1.2)
CO2: 26 mmol/L (ref 20–29)
Calcium: 10 mg/dL (ref 8.7–10.3)
Chloride: 103 mmol/L (ref 96–106)
Creatinine, Ser: 1.06 mg/dL — ABNORMAL HIGH (ref 0.57–1.00)
Globulin, Total: 2.8 g/dL (ref 1.5–4.5)
Glucose: 97 mg/dL (ref 70–99)
Potassium: 3.9 mmol/L (ref 3.5–5.2)
Sodium: 144 mmol/L (ref 134–144)
Total Protein: 6.9 g/dL (ref 6.0–8.5)
eGFR: 58 mL/min/{1.73_m2} — ABNORMAL LOW (ref >59)

## 2021-12-31 LAB — HEMOGLOBIN A1C
Est. average glucose Bld gHb Est-mCnc: 126 mg/dL
Hgb A1c MFr Bld: 6 % — ABNORMAL HIGH (ref 4.8–5.6)

## 2021-12-31 LAB — LIPID PANEL
Chol/HDL Ratio: 2.9 ratio (ref 0.0–4.4)
Cholesterol, Total: 177 mg/dL (ref 100–199)
HDL: 62 mg/dL (ref >39)
LDL Chol Calc (NIH): 97 mg/dL (ref 0–99)
Triglycerides: 100 mg/dL (ref 0–149)
VLDL Cholesterol Cal: 18 mg/dL (ref 5–40)

## 2021-12-31 NOTE — Progress Notes (Signed)
A1c shows pre-diabetes at 6% average blood glucose of 126 in 3 months. Continue to recommend balanced, lower carb meals. Smaller meal size, adding snacks. Choosing water as drink of choice and increasing purposeful exercise. Could start medication to assist if desired, would recommend farxiga or similar agent.  Cholesterol improved. I recommend diet low in saturated fat and regular exercise - 30 min at least 5 times per week  Kidney function slightly decreased. Continue to prioritize water as your drink of choice.  Gwyneth Sprout, [address] [place], [place] [address] [phone] (phone) [phone] (fax) [place]

## 2022-01-03 ENCOUNTER — Other Ambulatory Visit: Payer: Self-pay | Admitting: Family Medicine

## 2022-01-06 IMAGING — MG DIGITAL SCREENING BILAT W/ TOMO W/ CAD
8 series · 8 of 24 positions shown · non-contrast
Comparison: Previous exam(s).

CLINICAL DATA: Screening.

EXAM:
DIGITAL SCREENING BILATERAL MAMMOGRAM WITH TOMO AND CAD

[R MLO synth-2D]
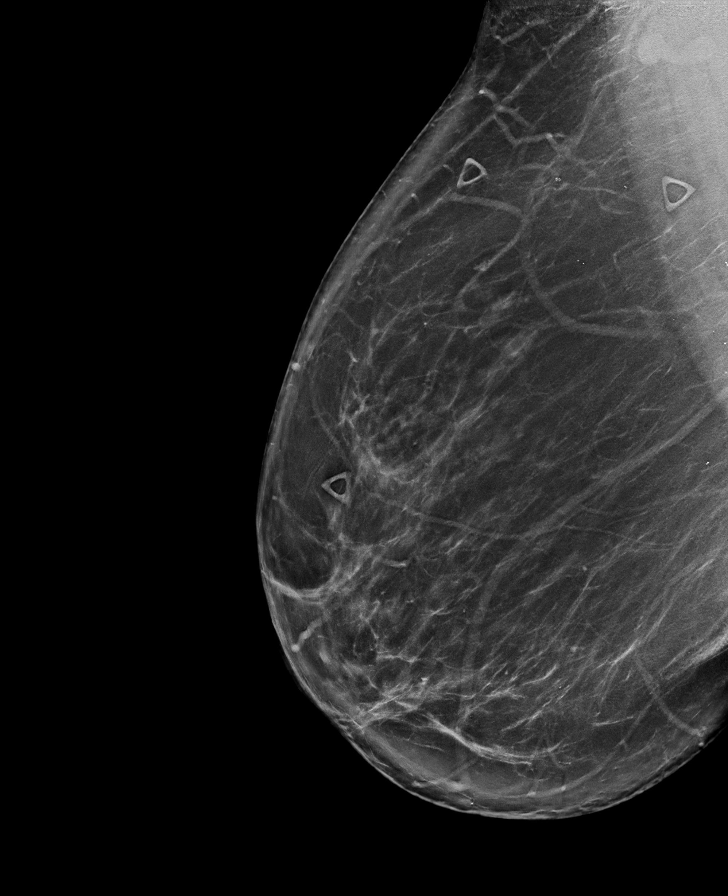

[L MLO synth-2D]
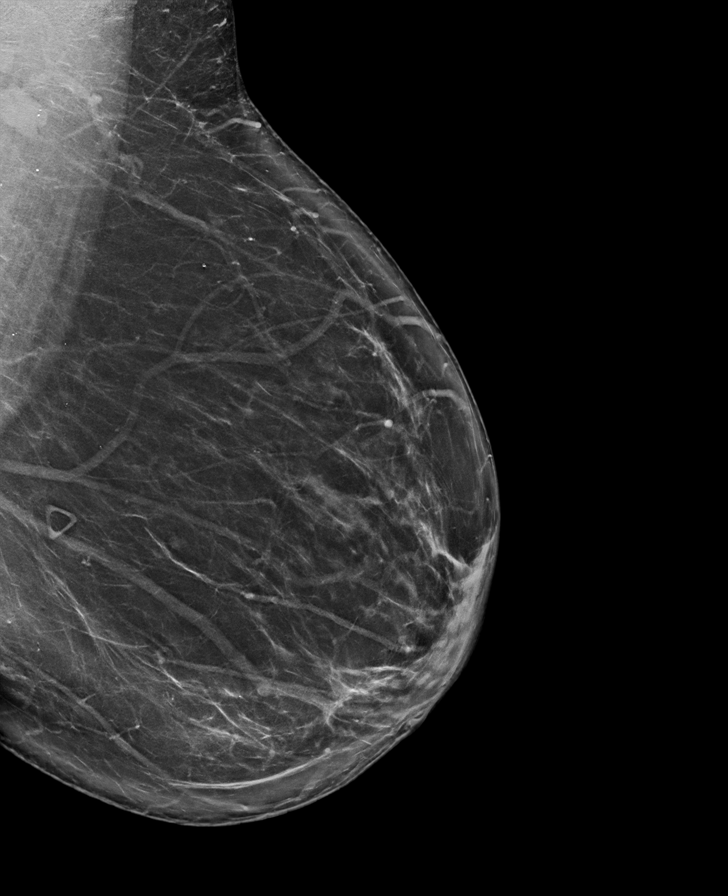

[R CC synth-2D]
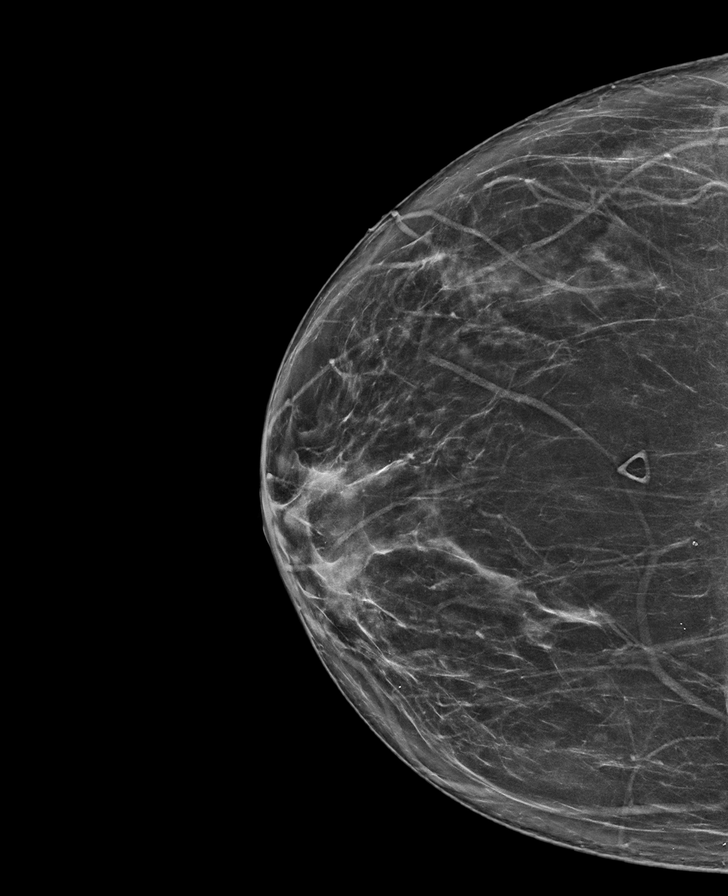

[L CC synth-2D]
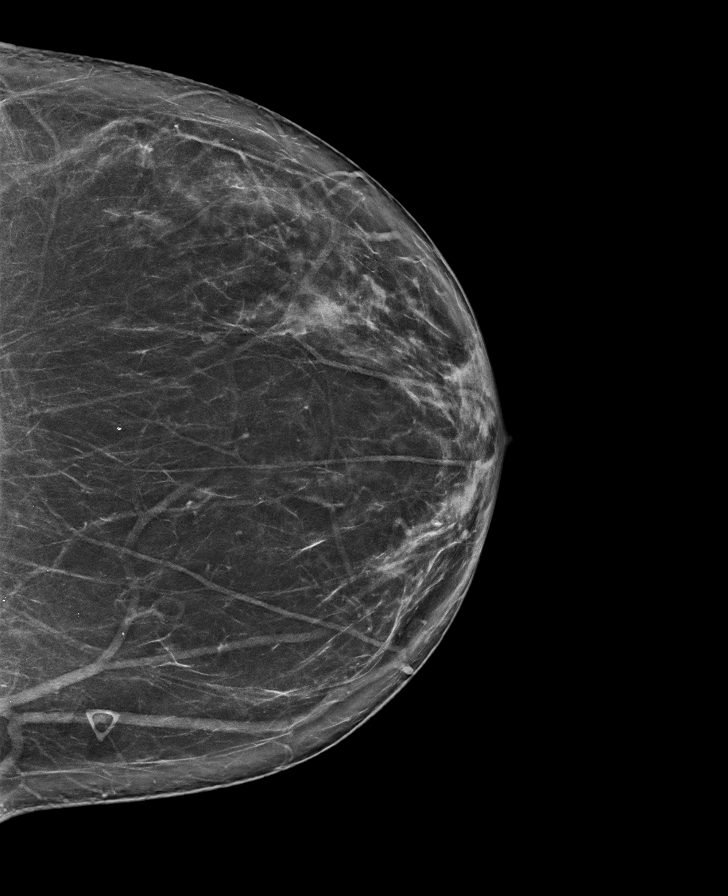

[L CC tomo · tomo slice 43/85.0]
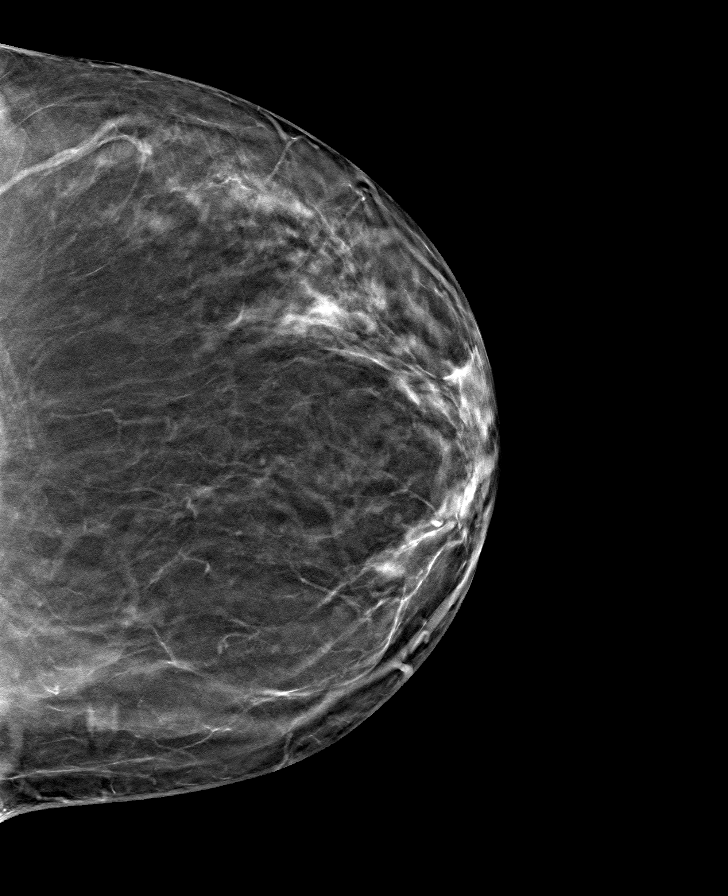

[L MLO tomo · tomo slice 49/96.0]
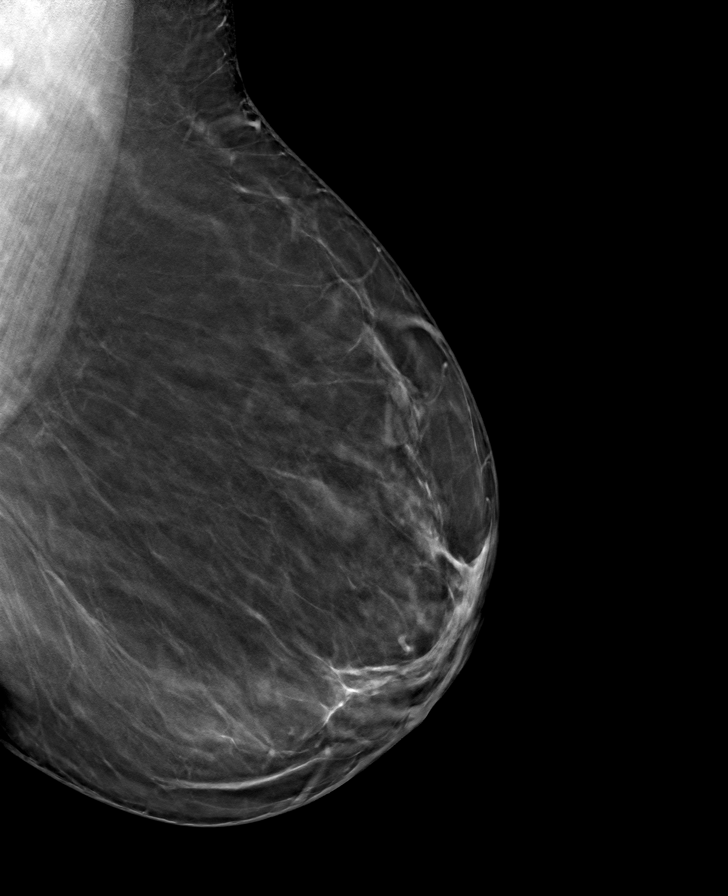

[R CC tomo · tomo slice 42/83.0]
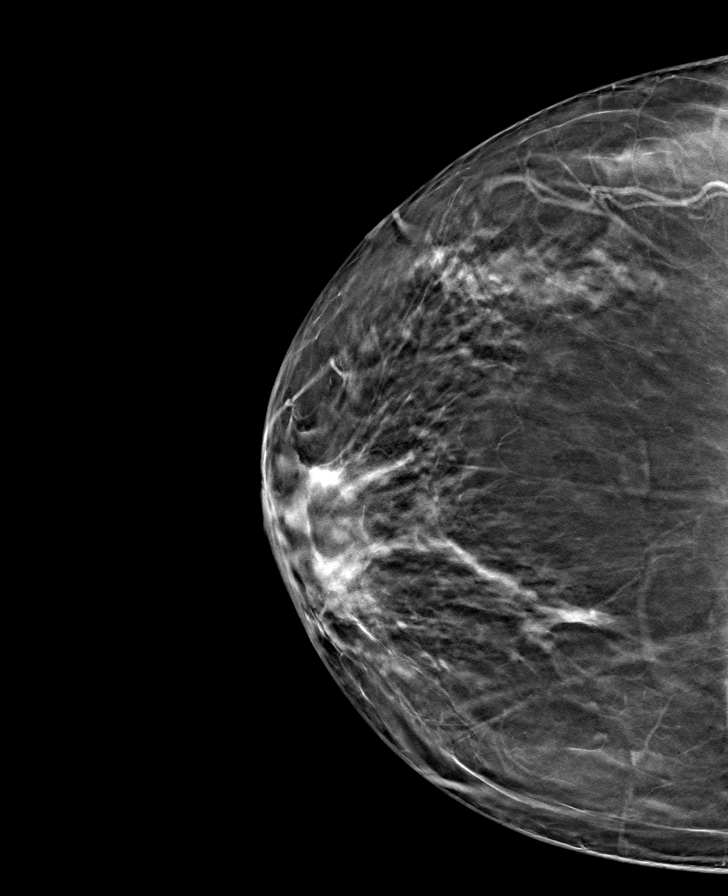

[R MLO tomo · tomo slice 51/101.0]
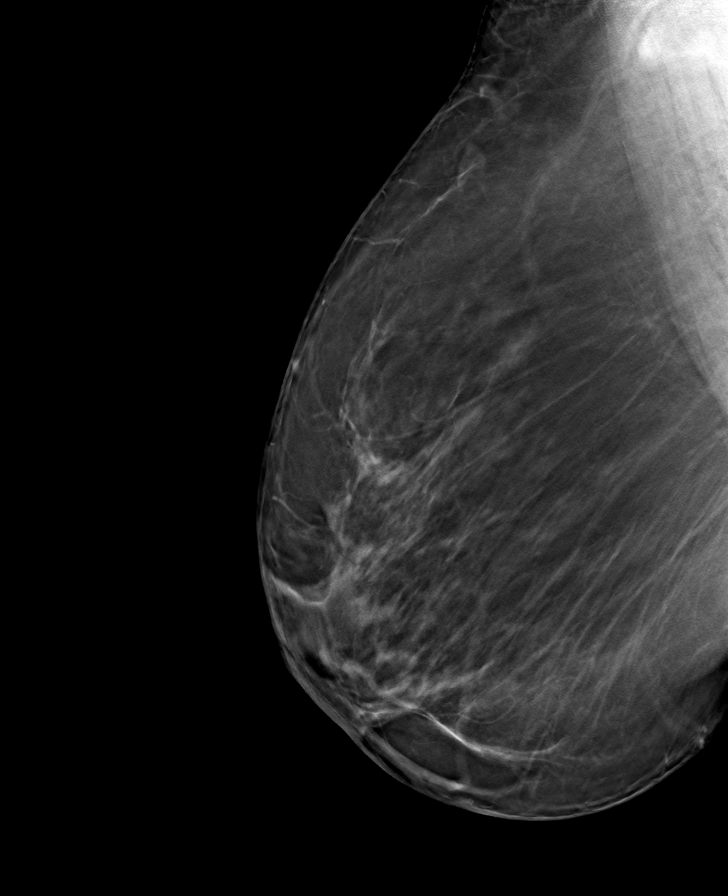

[8 of 24 positions shown; findings below may reference images not displayed]

ACR Breast Density Category b: There are scattered areas of
fibroglandular density.
FINDINGS: There are no findings suspicious for malignancy. Images were
processed with CAD.
IMPRESSION: No mammographic evidence of malignancy. A result letter of this
screening mammogram will be mailed directly to the patient.

RECOMMENDATION:
Screening mammogram in one year. (Code:[id number])

BI-RADS CATEGORY  1: Negative.

## 2022-02-07 ENCOUNTER — Other Ambulatory Visit: Payer: Self-pay | Admitting: Family Medicine

## 2022-02-07 DIAGNOSIS — I10 Essential (primary) hypertension: Secondary | ICD-10-CM | POA: Diagnosis not present

## 2022-02-07 DIAGNOSIS — R739 Hyperglycemia, unspecified: Secondary | ICD-10-CM | POA: Diagnosis not present

## 2022-02-07 DIAGNOSIS — E782 Mixed hyperlipidemia: Secondary | ICD-10-CM | POA: Diagnosis not present

## 2022-02-08 LAB — LIPID PANEL
Chol/HDL Ratio: 3.7 ratio (ref 0.0–4.4)
Cholesterol, Total: 130 mg/dL (ref 100–199)
HDL: 35 mg/dL — ABNORMAL LOW (ref >39)
LDL Chol Calc (NIH): 76 mg/dL (ref 0–99)
Triglycerides: 98 mg/dL (ref 0–149)
VLDL Cholesterol Cal: 19 mg/dL (ref 5–40)

## 2022-02-08 LAB — HEMOGLOBIN A1C
Est. average glucose Bld gHb Est-mCnc: 123 mg/dL
Hgb A1c MFr Bld: 5.9 % — ABNORMAL HIGH (ref 4.8–5.6)

## 2022-02-08 LAB — COMPREHENSIVE METABOLIC PANEL
ALT: 12 IU/L (ref 0–32)
AST: 19 IU/L (ref 0–40)
Albumin/Globulin Ratio: 1 — ABNORMAL LOW (ref 1.2–2.2)
Albumin: 3.4 g/dL — ABNORMAL LOW (ref 3.9–4.9)
Alkaline Phosphatase: 122 IU/L — ABNORMAL HIGH (ref 44–121)
BUN/Creatinine Ratio: 14 (ref 12–28)
BUN: 13 mg/dL (ref 8–27)
Bilirubin Total: 0.7 mg/dL (ref 0.0–1.2)
CO2: 28 mmol/L (ref 20–29)
Calcium: 9.3 mg/dL (ref 8.7–10.3)
Chloride: 103 mmol/L (ref 96–106)
Creatinine, Ser: 0.93 mg/dL (ref 0.57–1.00)
Globulin, Total: 3.3 g/dL (ref 1.5–4.5)
Glucose: 86 mg/dL (ref 70–99)
Potassium: 4.8 mmol/L (ref 3.5–5.2)
Sodium: 142 mmol/L (ref 134–144)
Total Protein: 6.7 g/dL (ref 6.0–8.5)
eGFR: 68 mL/min/{1.73_m2} (ref >59)

## 2022-02-08 NOTE — Progress Notes (Signed)
Blood chemistry shows recovered kidney function; this is very reassuring. Continue to recommend adequate sources of protein in your diet given decrease in albumin seen on blood sampling.  Cholesterol is stable. I recommend diet low in saturated fat and regular exercise - 30 min at least 5 times per week. Continue current dose of Pravastatin at 20 mg.   A1c shows pre-diabetes. Continue to recommend balanced, lower carb meals. Smaller meal size, adding snacks. Choosing water as drink of choice and increasing purposeful exercise.  Gwyneth Sprout, [address] [place], [place] [address] [phone] (phone) [phone] (fax) [place]

## 2022-02-26 DIAGNOSIS — Z79899 Other long term (current) drug therapy: Secondary | ICD-10-CM | POA: Diagnosis not present

## 2022-02-26 DIAGNOSIS — L102 Pemphigus foliaceous: Secondary | ICD-10-CM | POA: Diagnosis not present

## 2022-04-15 ENCOUNTER — Ambulatory Visit
Admission: RE | Admit: 2022-04-15 | Discharge: 2022-04-15 | Disposition: A | Discharge status: Home / Self Care | Payer: No Typology Code available for payment source | Source: Ambulatory Visit | Attending: Family Medicine | Admitting: Family Medicine

## 2022-04-15 ENCOUNTER — Ambulatory Visit
Admission: RE | Admit: 2022-04-15 | Discharge: 2022-04-15 | Disposition: A | Discharge status: Home / Self Care | Payer: No Typology Code available for payment source | Attending: Family Medicine | Admitting: Family Medicine

## 2022-04-15 ENCOUNTER — Encounter: Payer: Self-pay | Admitting: Family Medicine

## 2022-04-15 ENCOUNTER — Ambulatory Visit (INDEPENDENT_AMBULATORY_CARE_PROVIDER_SITE_OTHER): Payer: No Typology Code available for payment source | Admitting: Family Medicine

## 2022-04-15 VITALS — BP 120/61 | HR 76 | Temp 98.3°F | Resp 16 | Wt 219.0 lb

## 2022-04-15 DIAGNOSIS — M549 Dorsalgia, unspecified: Secondary | ICD-10-CM | POA: Diagnosis not present

## 2022-04-15 DIAGNOSIS — M25561 Pain in right knee: Secondary | ICD-10-CM | POA: Insufficient documentation

## 2022-04-15 DIAGNOSIS — G8929 Other chronic pain: Secondary | ICD-10-CM

## 2022-04-15 DIAGNOSIS — M25552 Pain in left hip: Secondary | ICD-10-CM | POA: Diagnosis not present

## 2022-04-15 DIAGNOSIS — M545 Low back pain, unspecified: Secondary | ICD-10-CM | POA: Diagnosis not present

## 2022-04-15 DIAGNOSIS — M4316 Spondylolisthesis, lumbar region: Secondary | ICD-10-CM | POA: Diagnosis not present

## 2022-04-15 NOTE — Progress Notes (Signed)
I,Roshena L Chambers,acting as a scribe for Lelon Huh, MD.,have documented all relevant documentation on the behalf of Lelon Huh, MD,as directed by  Lelon Huh, MD while in the presence of Lelon Huh, MD.   Established patient visit   Patient: Nicole Tyler   DOB: 1956/01/20   66 y.o. Female  MRN: [id number] Visit Date: 04/15/2022  Today's healthcare provider: Lelon Huh, MD   Chief Complaint  Patient presents with   Hip Pain   Subjective    Hip Pain  Incident onset: 1 year ago. There was no injury mechanism. The pain is present in the left hip. The quality of the pain is described as shooting (sharp pain radiates down her leg). The pain has been Worsening since onset.      Medications: Outpatient Medications Prior to Visit  Medication Sig Note   amLODipine-benazepril (LOTREL) 10-40 MG capsule Take 1 capsule by mouth daily.    cholecalciferol (VITAMIN D3) 25 MCG (1000 UNIT) tablet Take 1,000 Units by mouth daily.    ciprofloxacin (CILOXAN) 0.3 % ophthalmic solution Place 1 drop into both eyes every 4 (four) hours while awake.    clobetasol ointment (TEMOVATE) 0.05 % Apply topically 2 (two) times daily.    omeprazole (PRILOSEC) 20 MG capsule TAKE 1 CAPSULE BY MOUTH EVERY DAY Strength: 20 mg    pravastatin (PRAVACHOL) 20 MG tablet Take 1 tablet (20 mg total) by mouth daily.    predniSONE (DELTASONE) 10 MG tablet Take by mouth. 04/15/2022: Takes every other day    triamcinolone ointment (KENALOG) 0.1 % Apply topically 2 (two) times daily.    methotrexate (RHEUMATREX) 5 MG tablet Take 5 mg by mouth once a week. Caution: Chemotherapy. Protect from light. (Patient not taking: Reported on 04/15/2022)    [DISCONTINUED] folic acid (FOLVITE) 1 MG tablet Take 1 mg by mouth daily. (Patient not taking: Reported on 04/15/2022)    No facility-administered medications prior to visit.    Review of Systems  Constitutional:  Negative for appetite change, chills, fatigue and  fever.  Respiratory:  Negative for chest tightness and shortness of breath.   Cardiovascular:  Negative for chest pain and palpitations.  Gastrointestinal:  Negative for abdominal pain, nausea and vomiting.  Musculoskeletal:  Positive for arthralgias (left hip and right knee pain).  Neurological:  Negative for dizziness and weakness.       Objective    BP 120/61 (BP Location: Right Arm, Patient Position: Sitting, Cuff Size: Large)   Pulse 76   Temp 98.3 F (36.8 C) (Oral)   Resp 16   Wt 219 lb (99.3 kg)   SpO2 98% Comment: room air  BMI 38.79 kg/m    Physical Exam   General: Appearance:    Mildly obese female in no acute distress  Eyes:    PERRL, conjunctiva/corneas clear, EOM's intact       Lungs:     Clear to auscultation bilaterally, respirations unlabored  Heart:    Normal heart rate. Normal rhythm. No murmurs, rubs, or gallops.    MS:   All extremities are intact.  Tender lumbar spine, left of midline. Pain on limits of ROM of left hip. Mild tenderness along joint lines of right knee.   Neurologic:   Awake, alert, oriented x 3. No apparent focal neurological defect.         Assessment & Plan     1. Chronic pain of right knee  - DG Knee Complete 4 Views Right; Future  2. Back pain with radiation  - DG Lumbar Spine Complete; Future  3. Left hip pain  - DG Hip Unilat W OR W/O Pelvis 2-3 Views Left; Future      The entirety of the information documented in the History of Present Illness, Review of Systems and Physical Exam were personally obtained by me. Portions of this information were initially documented by the CMA and reviewed by me for thoroughness and accuracy.     Lelon Huh, MD  [facility] [phone] (phone) [phone] (fax)  [place]

## 2022-04-29 ENCOUNTER — Telehealth: Payer: Self-pay

## 2022-04-29 DIAGNOSIS — M25552 Pain in left hip: Secondary | ICD-10-CM

## 2022-04-29 NOTE — Telephone Encounter (Signed)
Patient called wanting referral to Dr. Sabra Heck at [facility]. Referral placed.

## 2022-05-08 DIAGNOSIS — R7303 Prediabetes: Secondary | ICD-10-CM | POA: Diagnosis not present

## 2022-05-08 DIAGNOSIS — M1990 Unspecified osteoarthritis, unspecified site: Secondary | ICD-10-CM | POA: Diagnosis not present

## 2022-05-08 DIAGNOSIS — Z86010 Personal history of colonic polyps: Secondary | ICD-10-CM | POA: Diagnosis not present

## 2022-05-08 DIAGNOSIS — I129 Hypertensive chronic kidney disease with stage 1 through stage 4 chronic kidney disease, or unspecified chronic kidney disease: Secondary | ICD-10-CM | POA: Diagnosis not present

## 2022-05-08 DIAGNOSIS — Z6837 Body mass index (BMI) 37.0-37.9, adult: Secondary | ICD-10-CM | POA: Diagnosis not present

## 2022-05-08 DIAGNOSIS — Z008 Encounter for other general examination: Secondary | ICD-10-CM | POA: Diagnosis not present

## 2022-05-08 DIAGNOSIS — E785 Hyperlipidemia, unspecified: Secondary | ICD-10-CM | POA: Diagnosis not present

## 2022-05-08 DIAGNOSIS — N182 Chronic kidney disease, stage 2 (mild): Secondary | ICD-10-CM | POA: Diagnosis not present

## 2022-05-15 DIAGNOSIS — M170 Bilateral primary osteoarthritis of knee: Secondary | ICD-10-CM | POA: Diagnosis not present

## 2022-05-28 DIAGNOSIS — L102 Pemphigus foliaceous: Secondary | ICD-10-CM | POA: Diagnosis not present

## 2022-05-28 DIAGNOSIS — Z79899 Other long term (current) drug therapy: Secondary | ICD-10-CM | POA: Diagnosis not present

## 2022-05-29 ENCOUNTER — Other Ambulatory Visit: Payer: Self-pay | Admitting: Family Medicine

## 2022-05-29 DIAGNOSIS — Z79899 Other long term (current) drug therapy: Secondary | ICD-10-CM | POA: Diagnosis not present

## 2022-05-29 NOTE — Telephone Encounter (Signed)
Requested Prescriptions  Pending Prescriptions Disp Refills   pravastatin (PRAVACHOL) 20 MG tablet [Pharmacy Med Name: PRAVASTATIN '20MG'$  TABLETS] 90 tablet 2    Sig: TAKE 1 TABLET(20 MG) BY MOUTH DAILY     Cardiovascular:  Antilipid - Statins Failed - 05/29/2022  3:42 AM      Failed - Lipid Panel in normal range within the last 12 months    Cholesterol, Total  Date Value Ref Range Status  02/07/2022 130 100 - 199 mg/dL Final   LDL Chol Calc (NIH)  Date Value Ref Range Status  02/07/2022 76 0 - 99 mg/dL Final   HDL  Date Value Ref Range Status  02/07/2022 35 (L) >39 mg/dL Final   Triglycerides  Date Value Ref Range Status  02/07/2022 98 0 - 149 mg/dL Final         Passed - Patient is not pregnant      Passed - Valid encounter within last 12 months    Recent Outpatient Visits           1 month ago Chronic pain of right knee   [facility] Birdie Sons, MD   9 months ago Need for influenza vaccination   [facility] Birdie Sons, MD   1 year ago Essential (primary) hypertension   [facility] Birdie Sons, MD   1 year ago Dermatitis   [place], Vickki Muff, [place]   2 years ago Tinea corporis   [place], Vickki Muff, [place]

## 2022-06-12 DIAGNOSIS — M25551 Pain in right hip: Secondary | ICD-10-CM | POA: Diagnosis not present

## 2022-08-13 ENCOUNTER — Ambulatory Visit (INDEPENDENT_AMBULATORY_CARE_PROVIDER_SITE_OTHER): Payer: No Typology Code available for payment source

## 2022-08-13 DIAGNOSIS — Z23 Encounter for immunization: Secondary | ICD-10-CM

## 2022-09-03 ENCOUNTER — Other Ambulatory Visit: Payer: Self-pay | Admitting: Family Medicine

## 2022-09-03 DIAGNOSIS — I10 Essential (primary) hypertension: Secondary | ICD-10-CM

## 2022-09-03 MED ORDER — AMLODIPINE BESY-BENAZEPRIL HCL 10-40 MG PO CAPS: 1.0000 | ORAL_CAPSULE | Freq: Every day | ORAL | 0 refills | Status: DC

## 2022-09-12 DIAGNOSIS — L4050 Arthropathic psoriasis, unspecified: Secondary | ICD-10-CM | POA: Diagnosis not present

## 2022-09-12 DIAGNOSIS — Z79899 Other long term (current) drug therapy: Secondary | ICD-10-CM | POA: Diagnosis not present

## 2022-09-12 DIAGNOSIS — L2089 Other atopic dermatitis: Secondary | ICD-10-CM | POA: Diagnosis not present

## 2022-09-12 DIAGNOSIS — L400 Psoriasis vulgaris: Secondary | ICD-10-CM | POA: Diagnosis not present

## 2022-09-15 DIAGNOSIS — Z79899 Other long term (current) drug therapy: Secondary | ICD-10-CM | POA: Diagnosis not present

## 2022-09-16 ENCOUNTER — Ambulatory Visit: Payer: No Typology Code available for payment source

## 2022-09-16 VITALS — Ht 63.0 in | Wt 219.0 lb

## 2022-09-16 DIAGNOSIS — Z0000 Encounter for general adult medical examination without abnormal findings: Secondary | ICD-10-CM | POA: Diagnosis not present

## 2022-09-16 NOTE — Patient Instructions (Signed)
Ms. Nicole Tyler , Thank you for taking time to come for your Medicare Wellness Visit. I appreciate your ongoing commitment to your health goals. Please review the following plan we discussed and let me know if I can assist you in the future.   These are the goals we discussed:  Goals      DIET - EAT MORE FRUITS AND VEGETABLES        This is a list of the screening recommended for you and due dates:  Health Maintenance  Topic Date Due   Hepatitis C Screening: USPSTF Recommendation to screen - Ages 49-79 yo.  Never done   Zoster (Shingles) Vaccine (1 of 2) Never done   Pneumonia Vaccine (2 of 2 - PCV) 08/03/2021   COVID-19 Vaccine (4 - 2023-24 season) 02/07/2022   Mammogram  06/26/2022   Flu Shot  01/08/2023   Medicare Annual Wellness Visit  09/16/2023   DTaP/Tdap/Td vaccine (2 - Td or Tdap) 08/15/2024   DEXA scan (bone density measurement)  10/31/2026   Colon Cancer Screening  05/31/2028   HPV Vaccine  Aged Out    Advanced directives: no  Conditions/risks identified: none  Next appointment: Follow up in one year for your annual wellness visit 09/22/2023 @ 1pm telephone   Preventive Care 65 Years and Older, Female Preventive care refers to lifestyle choices and visits with your health care provider that can promote health and wellness. What does preventive care include? A yearly physical exam. This is also called an annual well check. Dental exams once or twice a year. Routine eye exams. Ask your health care provider how often you should have your eyes checked. Personal lifestyle choices, including: Daily care of your teeth and gums. Regular physical activity. Eating a healthy diet. Avoiding tobacco and drug use. Limiting alcohol use. Practicing safe sex. Taking low-dose aspirin every day. Taking vitamin and mineral supplements as recommended by your health care provider. What happens during an annual well check? The services and screenings done by your health care provider  during your annual well check will depend on your age, overall health, lifestyle risk factors, and family history of disease. Counseling  Your health care provider may ask you questions about your: Alcohol use. Tobacco use. Drug use. Emotional well-being. Home and relationship well-being. Sexual activity. Eating habits. History of falls. Memory and ability to understand (cognition). Work and work Astronomer. Reproductive health. Screening  You may have the following tests or measurements: Height, weight, and BMI. Blood pressure. Lipid and cholesterol levels. These may be checked every 5 years, or more frequently if you are over 69 years old. Skin check. Lung cancer screening. You may have this screening every year starting at age 5 if you have a 30-pack-year history of smoking and currently smoke or have quit within the past 15 years. Fecal occult blood test (FOBT) of the stool. You may have this test every year starting at age 61. Flexible sigmoidoscopy or colonoscopy. You may have a sigmoidoscopy every 5 years or a colonoscopy every 10 years starting at age 45. Hepatitis C blood test. Hepatitis B blood test. Sexually transmitted disease (STD) testing. Diabetes screening. This is done by checking your blood sugar (glucose) after you have not eaten for a while (fasting). You may have this done every 1-3 years. Bone density scan. This is done to screen for osteoporosis. You may have this done starting at age 29. Mammogram. This may be done every 1-2 years. Talk to your health care provider about how  often you should have regular mammograms. Talk with your health care provider about your test results, treatment options, and if necessary, the need for more tests. Vaccines  Your health care provider may recommend certain vaccines, such as: Influenza vaccine. This is recommended every year. Tetanus, diphtheria, and acellular pertussis (Tdap, Td) vaccine. You may need a Td booster every  10 years. Zoster vaccine. You may need this after age 37. Pneumococcal 13-valent conjugate (PCV13) vaccine. One dose is recommended after age 74. Pneumococcal polysaccharide (PPSV23) vaccine. One dose is recommended after age 50. Talk to your health care provider about which screenings and vaccines you need and how often you need them. This information is not intended to replace advice given to you by your health care provider. Make sure you discuss any questions you have with your health care provider. Document Released: 06/22/2015 Document Revised: 02/13/2016 Document Reviewed: 03/27/2015 Elsevier Interactive Patient Education  2017 [place] Prevention in the Home Falls can cause injuries. They can happen to people of all ages. There are many things you can do to make your home safe and to help prevent falls. What can I do on the outside of my home? Regularly fix the edges of walkways and driveways and fix any cracks. Remove anything that might make you trip as you walk through a door, such as a raised step or threshold. Trim any bushes or trees on the path to your home. Use bright outdoor lighting. Clear any walking paths of anything that might make someone trip, such as rocks or tools. Regularly check to see if handrails are loose or broken. Make sure that both sides of any steps have handrails. Any raised decks and porches should have guardrails on the edges. Have any leaves, snow, or ice cleared regularly. Use sand or salt on walking paths during winter. Clean up any spills in your garage right away. This includes oil or grease spills. What can I do in the bathroom? Use night lights. Install grab bars by the toilet and in the tub and shower. Do not use towel bars as grab bars. Use non-skid mats or decals in the tub or shower. If you need to sit down in the shower, use a plastic, non-slip stool. Keep the floor dry. Clean up any water that spills on the floor as soon as it  happens. Remove soap buildup in the tub or shower regularly. Attach bath mats securely with double-sided non-slip rug tape. Do not have throw rugs and other things on the floor that can make you trip. What can I do in the bedroom? Use night lights. Make sure that you have a light by your bed that is easy to reach. Do not use any sheets or blankets that are too big for your bed. They should not hang down onto the floor. Have a firm chair that has side arms. You can use this for support while you get dressed. Do not have throw rugs and other things on the floor that can make you trip. What can I do in the kitchen? Clean up any spills right away. Avoid walking on wet floors. Keep items that you use a lot in easy-to-reach places. If you need to reach something above you, use a strong step stool that has a grab bar. Keep electrical cords out of the way. Do not use floor polish or wax that makes floors slippery. If you must use wax, use non-skid floor wax. Do not have throw rugs and other things  on the floor that can make you trip. What can I do with my stairs? Do not leave any items on the stairs. Make sure that there are handrails on both sides of the stairs and use them. Fix handrails that are broken or loose. Make sure that handrails are as long as the stairways. Check any carpeting to make sure that it is firmly attached to the stairs. Fix any carpet that is loose or worn. Avoid having throw rugs at the top or bottom of the stairs. If you do have throw rugs, attach them to the floor with carpet tape. Make sure that you have a light switch at the top of the stairs and the bottom of the stairs. If you do not have them, ask someone to add them for you. What else can I do to help prevent falls? Wear shoes that: Do not have high heels. Have rubber bottoms. Are comfortable and fit you well. Are closed at the toe. Do not wear sandals. If you use a stepladder: Make sure that it is fully opened.  Do not climb a closed stepladder. Make sure that both sides of the stepladder are locked into place. Ask someone to hold it for you, if possible. Clearly mark and make sure that you can see: Any grab bars or handrails. First and last steps. Where the edge of each step is. Use tools that help you move around (mobility aids) if they are needed. These include: Canes. Walkers. Scooters. Crutches. Turn on the lights when you go into a dark area. Replace any light bulbs as soon as they burn out. Set up your furniture so you have a clear path. Avoid moving your furniture around. If any of your floors are uneven, fix them. If there are any pets around you, be aware of where they are. Review your medicines with your doctor. Some medicines can make you feel dizzy. This can increase your chance of falling. Ask your doctor what other things that you can do to help prevent falls. This information is not intended to replace advice given to you by your health care provider. Make sure you discuss any questions you have with your health care provider. Document Released: 03/22/2009 Document Revised: 11/01/2015 Document Reviewed: 06/30/2014 Elsevier Interactive Patient Education  2017 [employer].

## 2022-09-16 NOTE — Progress Notes (Signed)
I connected with  Nicole Tyler on 09/16/22 by a audio enabled telemedicine application and verified that I am speaking with the correct person using two identifiers.  Patient Location: Home  Provider Location: Office/Clinic  I discussed the limitations of evaluation and management by telemedicine. The patient expressed understanding and agreed to proceed.  Subjective:   Nicole Tyler is a 67 y.o. female who presents for Medicare Annual (Subsequent) preventive examination.  Review of Systems    Cardiac Risk Factors include: advanced age (>2755men, 108>65 women);dyslipidemia;hypertension;obesity (BMI >30kg/m2)    Objective:    Today's Vitals   09/16/22 1313 09/16/22 1314  Weight: 219 lb (99.3 kg)   Height: 5\' 3"  (1.6 m)   PainSc:  5    Body mass index is 38.79 kg/m.     09/16/2022    1:21 PM 09/10/2021    2:23 PM 02/08/2016    7:00 AM 09/17/2015   10:05 AM  Advanced Directives  Does Patient Have a Medical Advance Directive? No No No No  Would patient like information on creating a medical advance directive?  No - Patient declined No - patient declined information No - patient declined information    Current Medications (verified) Outpatient Encounter Medications as of 09/16/2022  Medication Sig   amLODipine-benazepril (LOTREL) 10-40 MG capsule Take 1 capsule by mouth daily.   cholecalciferol (VITAMIN D3) 25 MCG (1000 UNIT) tablet Take 1,000 Units by mouth daily.   ciprofloxacin (CILOXAN) 0.3 % ophthalmic solution Place 1 drop into both eyes every 4 (four) hours while awake.   clobetasol ointment (TEMOVATE) 0.05 % Apply topically 2 (two) times daily.   omeprazole (PRILOSEC) 20 MG capsule TAKE 1 CAPSULE BY MOUTH EVERY DAY Strength: 20 mg   pravastatin (PRAVACHOL) 20 MG tablet TAKE 1 TABLET(20 MG) BY MOUTH DAILY   triamcinolone ointment (KENALOG) 0.1 % Apply topically 2 (two) times daily.   methotrexate (RHEUMATREX) 5 MG tablet Take 5 mg by mouth once a week. Caution: Chemotherapy.  Protect from light. (Patient not taking: Reported on 04/15/2022)   predniSONE (DELTASONE) 10 MG tablet Take by mouth. (Patient not taking: Reported on 09/16/2022)   No facility-administered encounter medications on file as of 09/16/2022.    Allergies (verified) Patient has no known allergies.   History: Past Medical History:  Diagnosis Date   Benign neoplasm of colon    Benign neoplasm of sigmoid colon    Hypertension    Past Surgical History:  Procedure Laterality Date   BUNIONECTOMY Right    COLONOSCOPY WITH PROPOFOL N/A 05/31/2021   Procedure: COLONOSCOPY WITH BIOPSY;  Surgeon: Midge MiniumWohl, Darren, MD;  [facility];  Service: Endoscopy;  Laterality: N/A;   FLEXIBLE SIGMOIDOSCOPY N/A 09/17/2015   Procedure: FLEXIBLE SIGMOIDOSCOPY;  Surgeon: Midge Miniumarren Wohl, MD;  [facility];  Service: Endoscopy;  Laterality: N/A;   FLEXIBLE SIGMOIDOSCOPY N/A 02/08/2016   Procedure: FLEXIBLE SIGMOIDOSCOPY;  Surgeon: Midge Miniumarren Wohl, MD;  [facility];  Service: Endoscopy;  Laterality: N/A;   HAND SURGERY  1984   has repair to lt hand [facility] caught in machine rollers.   POLYPECTOMY  09/17/2015   Procedure: POLYPECTOMY INTESTINAL;  Surgeon: Midge Miniumarren Wohl, MD;  [facility];  Service: Endoscopy;;  Sigmoid colon polyp ( several pieces from the same polyp)     POLYPECTOMY N/A 05/31/2021   Procedure: POLYPECTOMY;  Surgeon: Midge MiniumWohl, Darren, MD;  [facility];  Service: Endoscopy;  Laterality: N/A;   Family History  Problem Relation Age of Onset   Hypertension Mother    Hypertension Father    Colon cancer Father    Asthma Daughter    Breast cancer Daughter    Hypertension Maternal Grandmother    Stroke Maternal Grandmother    Breast cancer Maternal Grandmother    Hypertension Daughter    Social History   Socioeconomic History   Marital status: Married    Spouse name: Not on file   Number of children: Not on file    Years of education: Not on file   Highest education level: Not on file  Occupational History    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  Tobacco Use   Smoking status: Never   Smokeless tobacco: Never  Substance and Sexual Activity   Alcohol use: No    Alcohol/week: 0.0 standard drinks of alcohol   Drug use: No   Sexual activity: Not on file  Other Topics Concern   Not on file  Social History Narrative   Not on file   Social Determinants of Health   Financial Resource Strain: Low Risk  (09/16/2022)   Overall Financial Resource Strain (CARDIA)    Difficulty of Paying Living Expenses: Not hard at all  Food Insecurity: No Food Insecurity (09/16/2022)   Hunger Vital Sign    Worried About Running Out of Food in the Last Year: Never true    Ran Out of Food in the Last Year: Never true  Transportation Needs: No Transportation Needs (09/16/2022)   PRAPARE - Administrator, Civil Service (Medical): No    Lack of Transportation (Non-Medical): No  Physical Activity: Insufficiently Active (09/16/2022)   Exercise Vital Sign    Days of Exercise per Week: 3 days    Minutes of Exercise per Session: 30 min  Stress: No Stress Concern Present (09/16/2022)   [employer] of Occupational Health - Occupational Stress Questionnaire    Feeling of Stress : Not at all  Social Connections: Socially Integrated (09/16/2022)   Social Connection and Isolation Panel [NHANES]    Frequency of Communication with Friends and Family: More than three times a week    Frequency of Social Gatherings with Friends and Family: More than three times a week    Attends Religious Services: 1 to 4 times per year    Active Member of [employer] or Organizations: Yes    Attends Banker Meetings: 1 to 4 times per year    Marital Status: Married    Tobacco Counseling Counseling given: Not Answered  Clinical Intake:  Pre-visit preparation completed: Yes  Pain : 0-10 Pain Score: 5  Pain Type: Chronic  pain Pain Location: Knee (both) Pain Descriptors / Indicators: Aching Pain Onset: More than a month ago Pain Frequency: Intermittent Pain Relieving Factors: heat, IBU, cortisone injectioms  Pain Relieving Factors: heat, IBU, cortisone injectioms  BMI - recorded: 38.79 Nutritional Status: BMI > 30  Obese Nutritional Risks: None Diabetes: No  How often do you need to have someone help you when you read instructions, pamphlets, or other written materials from your doctor or pharmacy?: 1 - Never  Diabetic?NO  Interpreter Needed?: No  Comments: lives with husband and grandaughter Information entered by :: B.Guiliana Shor,LPN   Activities of Daily Living    09/16/2022    1:21 PM  In your present state of health, do you have any difficulty performing the following activities:  Hearing? 0  Vision? 0  Difficulty concentrating or making decisions? 0  Walking or  climbing stairs? 0  Dressing or bathing? 0  Doing errands, shopping? 0  Preparing Food and eating ? N  Using the Toilet? N  In the past six months, have you accidently leaked urine? N  Do you have problems with loss of bowel control? N  Managing your Medications? N  Managing your Finances? N  Housekeeping or managing your Housekeeping? N    Patient Care Team: Malva Limes, MD as PCP - General (Family Medicine) Sherrin Daisy, MD as Referring Physician (Dermatology) Midge Minium, MD as Consulting Physician (Gastroenterology)  Indicate any recent Medical Services you may have received from other than [facility] providers in the past year (date may be approximate).     Assessment:   This is a routine wellness examination for Nicole Tyler.  Hearing/Vision screen Hearing Screening - Comments:: Adequate hearing Vision Screening - Comments:: Adequate vision w/glasses Dr Clydene Pugh   Dietary issues and exercise activities discussed: Current Exercise Habits: Home exercise routine, Type of exercise: walking, Time (Minutes): 30,  Frequency (Times/Week): 3, Weekly Exercise (Minutes/Week): 90, Intensity: Mild, Exercise limited by: None identified   Goals Addressed             This Visit's Progress    DIET - EAT MORE FRUITS AND VEGETABLES   On track      Depression Screen    09/16/2022    1:19 PM 09/10/2021    2:22 PM 08/30/2021    1:36 PM 05/09/2020    8:43 AM 05/26/2017    8:56 AM 08/27/2015   10:24 AM  PHQ 2/9 Scores  PHQ - 2 Score 0 0 0 0 0 0  PHQ- 9 Score  0 0 2  2    Fall Risk    09/16/2022    1:16 PM 09/10/2021    2:24 PM 09/09/2021    3:23 PM 08/30/2021    1:37 PM 05/09/2020    8:44 AM  Fall Risk   Falls in the past year? 0 0 0 0   Number falls in past yr: 0 0 0 0   Injury with Fall? 0 0 0 0   Risk for fall due to : No Fall Risks No Fall Risks  No Fall Risks No Fall Risks  Follow up Education provided;Falls prevention discussed Falls evaluation completed  Falls evaluation completed Falls evaluation completed    FALL RISK PREVENTION PERTAINING TO THE HOME:  Any stairs in or around the home? No  If so, are there any without handrails? No  Home free of loose throw rugs in walkways, pet beds, electrical cords, etc? Yes  Adequate lighting in your home to reduce risk of falls? Yes   ASSISTIVE DEVICES UTILIZED TO PREVENT FALLS:  Life alert? No  Use of a cane, walker or w/c? No  Grab bars in the bathroom? Yes  Shower chair or bench in shower? Yes  Elevated toilet seat or a handicapped toilet? Yes   Cognitive Function:        09/16/2022    1:24 PM  6CIT Screen  What Year? 0 points  What month? 0 points  What time? 0 points  Count back from 20 0 points  Months in reverse 0 points  Repeat phrase 0 points  Total Score 0 points    Immunizations Immunization History  Administered Date(s) Administered   Fluad Quad(high Dose 65+) 08/30/2021, 08/13/2022   Influenza,inj,Quad PF,6+ Mos 02/19/2016, 05/26/2017, 03/14/2019, 02/22/2020   Moderna Sars-Covid-2 Vaccination 05/07/2020   PFIZER(Purple  Top)SARS-COV-2 Vaccination 09/13/2019, 10/04/2019  Pneumococcal Polysaccharide-23 08/03/2020   Tdap 08/16/2014   Zoster, Live 08/27/2015    TDAP status: Up to date  Flu Vaccine status: Up to date  Pneumococcal vaccine status: Up to date  Covid-19 vaccine status: Completed vaccines  Qualifies for Shingles Vaccine? Yes   Zostavax completed No   Shingrix Completed?: No.    Education has been provided regarding the importance of this vaccine. Patient has been advised to call insurance company to determine out of pocket expense if they have not yet received this vaccine. Advised may also receive vaccine at local pharmacy or Health Dept. Verbalized acceptance and understanding.  Screening Tests Health Maintenance  Topic Date Due   Hepatitis C Screening  Never done   Zoster Vaccines- Shingrix (1 of 2) Never done   Pneumonia Vaccine 61+ Years old (2 of 2 - PCV) 08/03/2021   COVID-19 Vaccine (4 - 2023-24 season) 02/07/2022   MAMMOGRAM  06/26/2022   INFLUENZA VACCINE  01/08/2023   Medicare Annual Wellness (AWV)  09/16/2023   DTaP/Tdap/Td (2 - Td or Tdap) 08/15/2024   DEXA SCAN  10/31/2026   COLONOSCOPY (Pts 45-30yrs Insurance coverage will need to be confirmed)  05/31/2028   HPV VACCINES  Aged Out    Health Maintenance  Health Maintenance Due  Topic Date Due   Hepatitis C Screening  Never done   Zoster Vaccines- Shingrix (1 of 2) Never done   Pneumonia Vaccine 65+ Years old (2 of 2 - PCV) 08/03/2021   COVID-19 Vaccine (4 - 2023-24 season) 02/07/2022   MAMMOGRAM  06/26/2022    Colorectal cancer screening: Type of screening: Colonoscopy. Completed yes. Repeat every 5-10 years  Mammogram status: Ordered yes. Pt provided with contact info and advised to call to schedule appt.   Bone Density status: Completed yes. Results reflect: Bone density results: NORMAL. Repeat every 5 years.  Lung Cancer Screening: (Low Dose CT Chest recommended if Age 38-80 years, 30 pack-year currently  smoking OR have quit w/in 15years.) does not qualify.   Lung Cancer Screening Referral: no  Additional Screening:  Hepatitis C Screening: does not qualify; Completed no  Vision Screening: Recommended annual ophthalmology exams for early detection of glaucoma and other disorders of the eye. Is the patient up to date with their annual eye exam?  Yes  Who is the provider or what is the name of the office in which the patient attends annual eye exams? Dr Clydene Pugh If pt is not established with a provider, would they like to be referred to a provider to establish care? No .   Dental Screening: Recommended annual dental exams for proper oral hygiene  Community Resource Referral / Chronic Care Management: CRR required this visit?  No   CCM required this visit?  No     Plan:     I have personally reviewed and noted the following in the patient's chart:   Medical and social history Use of alcohol, tobacco or illicit drugs  Current medications and supplements including opioid prescriptions. Patient is not currently taking opioid prescriptions. Functional ability and status Nutritional status Physical activity Advanced directives List of other physicians Hospitalizations, surgeries, and ER visits in previous 12 months Vitals Screenings to include cognitive, depression, and falls Referrals and appointments  In addition, I have reviewed and discussed with patient certain preventive protocols, quality metrics, and best practice recommendations. A written personalized care plan for preventive services as well as general preventive health recommendations were provided to patient.    Sue Lush,  LPN   10/12/9792   Nurse Notes: The patient states she is doing well and has no concerns or questions at this time.

## 2022-10-03 ENCOUNTER — Other Ambulatory Visit: Payer: Self-pay | Admitting: Family Medicine

## 2022-10-03 DIAGNOSIS — I10 Essential (primary) hypertension: Secondary | ICD-10-CM

## 2022-10-03 MED ORDER — AMLODIPINE BESY-BENAZEPRIL HCL 10-40 MG PO CAPS: 1.0000 | ORAL_CAPSULE | Freq: Every day | ORAL | 0 refills | Status: DC

## 2022-10-29 DIAGNOSIS — M545 Low back pain, unspecified: Secondary | ICD-10-CM | POA: Diagnosis not present

## 2022-10-29 DIAGNOSIS — M25552 Pain in left hip: Secondary | ICD-10-CM | POA: Diagnosis not present

## 2022-11-03 ENCOUNTER — Other Ambulatory Visit: Payer: Self-pay | Admitting: Family Medicine

## 2022-11-03 DIAGNOSIS — I10 Essential (primary) hypertension: Secondary | ICD-10-CM

## 2022-11-04 NOTE — Telephone Encounter (Signed)
Requested Prescriptions  Pending Prescriptions Disp Refills   amLODipine-benazepril (LOTREL) 10-40 MG capsule [Pharmacy Med Name: AMLODIPINE-BENAZ 10/40MG  CAPSULES] 90 capsule 0    Sig: TAKE 1 CAPSULE BY MOUTH DAILY     Cardiovascular: CCB + ACEI Combos Failed - 11/03/2022  3:40 AM      Failed - Cr in normal range and within 180 days    Creatinine, Ser  Date Value Ref Range Status  02/07/2022 0.93 0.57 - 1.00 mg/dL Final         Failed - K in normal range and within 180 days    Potassium  Date Value Ref Range Status  02/07/2022 4.8 3.5 - 5.2 mmol/L Final         Failed - Na in normal range and within 180 days    Sodium  Date Value Ref Range Status  02/07/2022 142 134 - 144 mmol/L Final         Failed - eGFR is 30 or above and within 180 days    GFR calc Af Amer  Date Value Ref Range Status  01/30/2020 85 >59 mL/min/1.73 Final    Comment:    **[facility] currently reports eGFR in compliance with the current**   recommendations of the [employer]. [facility] will   update reporting as new guidelines are published from the NKF-ASN   Task force.    GFR calc non Af Amer  Date Value Ref Range Status  01/30/2020 74 >59 mL/min/1.73 Final   eGFR  Date Value Ref Range Status  02/07/2022 68 >59 mL/min/1.73 Final         Passed - Patient is not pregnant      Passed - Last BP in normal range    BP Readings from Last 1 Encounters:  04/15/22 120/61         Passed - Valid encounter within last 6 months    Recent Outpatient Visits           6 months ago Chronic pain of right knee   [facility] Malva Limes, MD   1 year ago Need for influenza vaccination   [facility] Malva Limes, MD   2 years ago Essential (primary) hypertension   [place] [facility] Malva Limes, MD   2 years ago Dermatitis   [facility] Chrismon, Jodell [place], [place]   2 years ago Tinea  corporis   [facility] Chrismon, Jodell [place], [place]

## 2022-12-17 DIAGNOSIS — L2089 Other atopic dermatitis: Secondary | ICD-10-CM | POA: Diagnosis not present

## 2022-12-17 DIAGNOSIS — Z79899 Other long term (current) drug therapy: Secondary | ICD-10-CM | POA: Diagnosis not present

## 2022-12-17 DIAGNOSIS — L400 Psoriasis vulgaris: Secondary | ICD-10-CM | POA: Diagnosis not present

## 2022-12-17 DIAGNOSIS — L102 Pemphigus foliaceous: Secondary | ICD-10-CM | POA: Diagnosis not present

## 2022-12-18 DIAGNOSIS — Z79899 Other long term (current) drug therapy: Secondary | ICD-10-CM | POA: Diagnosis not present

## 2023-02-01 ENCOUNTER — Other Ambulatory Visit: Payer: Self-pay | Admitting: Family Medicine

## 2023-02-01 DIAGNOSIS — I10 Essential (primary) hypertension: Secondary | ICD-10-CM

## 2023-02-03 NOTE — Telephone Encounter (Signed)
Requested medication (s) are due for refill today: yes  Requested medication (s) are on the active medication list:yes  Last refill:         2 months ago (11/04/2022)    Future visit scheduled: no  Notes to clinic:  overdue lab work    Requested Prescriptions  Pending Prescriptions Disp Refills   amLODipine-benazepril (LOTREL) 10-40 MG capsule [Pharmacy Med Name: AMLODIPINE-BENAZ 10/40MG  CAPSULES] 90 capsule 0    Sig: TAKE 1 CAPSULE BY MOUTH DAILY     Cardiovascular: CCB + ACEI Combos Failed - 02/01/2023  3:30 AM      Failed - Cr in normal range and within 180 days    Creatinine, Ser  Date Value Ref Range Status  02/07/2022 0.93 0.57 - 1.00 mg/dL Final         Failed - K in normal range and within 180 days    Potassium  Date Value Ref Range Status  02/07/2022 4.8 3.5 - 5.2 mmol/L Final         Failed - Na in normal range and within 180 days    Sodium  Date Value Ref Range Status  02/07/2022 142 134 - 144 mmol/L Final         Failed - eGFR is 30 or above and within 180 days    GFR calc Af Amer  Date Value Ref Range Status  01/30/2020 85 >59 mL/min/1.73 Final    Comment:    **[facility] currently reports eGFR in compliance with the current**   recommendations of the [employer]. [facility] will   update reporting as new guidelines are published from the NKF-ASN   Task force.    GFR calc non Af Amer  Date Value Ref Range Status  01/30/2020 74 >59 mL/min/1.73 Final   eGFR  Date Value Ref Range Status  02/07/2022 68 >59 mL/min/1.73 Final         Passed - Patient is not pregnant      Passed - Last BP in normal range    BP Readings from Last 1 Encounters:  04/15/22 120/61         Passed - Valid encounter within last 6 months    Recent Outpatient Visits           9 months ago Chronic pain of right knee   [facility] Malva Limes, MD   1 year ago Need for influenza vaccination   [facility] Malva Limes, MD   2 years ago Essential (primary) hypertension   [facility] Malva Limes, MD   2 years ago Dermatitis   [facility] Chrismon, Jodell [place], [place]   2 years ago Tinea corporis   [facility] Chrismon, Jodell [place], [place]

## 2023-02-06 ENCOUNTER — Other Ambulatory Visit: Payer: Self-pay | Admitting: Family Medicine

## 2023-02-06 DIAGNOSIS — I10 Essential (primary) hypertension: Secondary | ICD-10-CM

## 2023-02-13 DIAGNOSIS — R2681 Unsteadiness on feet: Secondary | ICD-10-CM | POA: Diagnosis not present

## 2023-02-13 DIAGNOSIS — Z008 Encounter for other general examination: Secondary | ICD-10-CM | POA: Diagnosis not present

## 2023-02-13 DIAGNOSIS — Z6837 Body mass index (BMI) 37.0-37.9, adult: Secondary | ICD-10-CM | POA: Diagnosis not present

## 2023-02-13 DIAGNOSIS — I10 Essential (primary) hypertension: Secondary | ICD-10-CM | POA: Diagnosis not present

## 2023-02-20 DIAGNOSIS — M170 Bilateral primary osteoarthritis of knee: Secondary | ICD-10-CM | POA: Diagnosis not present

## 2023-03-11 ENCOUNTER — Telehealth: Payer: Self-pay | Admitting: Family Medicine

## 2023-03-11 MED ORDER — PRAVASTATIN SODIUM 20 MG PO TABS: ORAL_TABLET | ORAL | 0 refills | Status: DC

## 2023-03-11 NOTE — Telephone Encounter (Signed)
Prescription send in to [facility] per patient. Patient is aware that she is due for appointment.

## 2023-03-11 NOTE — Telephone Encounter (Signed)
[employer] requesting prescription refill pravastatin (PRAVACHOL) 20 MG tablet   Please advise

## 2023-03-23 ENCOUNTER — Encounter: Payer: Self-pay | Admitting: Family Medicine

## 2023-03-23 ENCOUNTER — Ambulatory Visit (INDEPENDENT_AMBULATORY_CARE_PROVIDER_SITE_OTHER): Payer: No Typology Code available for payment source | Admitting: Family Medicine

## 2023-03-23 VITALS — BP 134/71 | HR 79 | Temp 98.8°F | Ht 63.0 in | Wt 221.5 lb

## 2023-03-23 DIAGNOSIS — E782 Mixed hyperlipidemia: Secondary | ICD-10-CM | POA: Diagnosis not present

## 2023-03-23 DIAGNOSIS — L409 Psoriasis, unspecified: Secondary | ICD-10-CM | POA: Diagnosis not present

## 2023-03-23 DIAGNOSIS — I10 Essential (primary) hypertension: Secondary | ICD-10-CM

## 2023-03-23 DIAGNOSIS — R7303 Prediabetes: Secondary | ICD-10-CM

## 2023-03-23 NOTE — Progress Notes (Signed)
Established patient visit   Patient: Nicole Tyler   DOB: Nov 13, 1955   67 y.o. Female  MRN: [id number] Visit Date: 03/23/2023  Today's healthcare provider: Mila Merry, MD   Chief Complaint  Patient presents with   Follow-up    Follow up with skin condition and needs labs.   Subjective    HPI  Patient presents for follow up hypertension, lipids and skin rash. She states she has seen dermatology for rash and diagnosed with a form of psoriasis, which is greatly improved on current medications. Otherwise feels well with no complaints. Continues on pravastatin which she reports no adverse effects.   Lab Results  Component Value Date   CHOL 130 02/07/2022   HDL 35 (L) 02/07/2022   LDLCALC 76 02/07/2022   TRIG 98 02/07/2022   CHOLHDL 3.7 02/07/2022   Lab Results  Component Value Date   NA 142 02/07/2022   K 4.8 02/07/2022   CREATININE 0.93 02/07/2022   EGFR 68 02/07/2022   GLUCOSE 86 02/07/2022   Lab Results  Component Value Date   HGBA1C 5.9 (H) 02/07/2022    Medications: Outpatient Medications Prior to Visit  Medication Sig   amLODipine-benazepril (LOTREL) 10-40 MG capsule TAKE 1 CAPSULE BY MOUTH DAILY   clobetasol ointment (TEMOVATE) 0.05 % Apply topically 2 (two) times daily.   ergocalciferol (VITAMIN D2) 1.25 MG (50000 UT) capsule Take 50,000 Units by mouth once a week. Pt takes 1 tablet twice a week.   omeprazole (PRILOSEC) 20 MG capsule TAKE 1 CAPSULE BY MOUTH EVERY DAY Strength: 20 mg   pravastatin (PRAVACHOL) 20 MG tablet TAKE 1 TABLET(20 MG) BY MOUTH DAILY   triamcinolone ointment (KENALOG) 0.1 % Apply topically 2 (two) times daily.   cholecalciferol (VITAMIN D3) 25 MCG (1000 UNIT) tablet Take 1,000 Units by mouth daily. (Patient not taking: Reported on 03/23/2023)   ciprofloxacin (CILOXAN) 0.3 % ophthalmic solution Place 1 drop into both eyes every 4 (four) hours while awake. (Patient not taking: Reported on 03/23/2023)   No facility-administered  medications prior to visit.    Review of Systems  Respiratory: Negative.  Negative for cough, shortness of breath and wheezing.   Cardiovascular:  Negative for chest pain, palpitations and leg swelling.  Neurological:  Negative for weakness and headaches.       Objective    BP 134/71   Pulse 79   Temp 98.8 F (37.1 C) (Oral)   Ht 5\' 3"  (1.6 m)   Wt 221 lb 8 oz (100.5 kg)   SpO2 97%   BMI 39.24 kg/m    Physical Exam   General: Appearance:    Obese female in no acute distress  Eyes:    PERRL, conjunctiva/corneas clear, EOM's intact       Lungs:     Clear to auscultation bilaterally, respirations unlabored  Heart:    Normal heart rate. Normal rhythm. No murmurs, rubs, or gallops.    MS:   All extremities are intact.    Neurologic:   Awake, alert, oriented x 3. No apparent focal neurological defect.         Assessment & Plan     1. Essential (primary) hypertension Well controlled.  Continue current medications.   - Lipid panel - Comprehensive metabolic panel - TSH  2. Mixed hyperlipidemia She is tolerating pravastatin well with no adverse effects.    3. Prediabetes  - Hemoglobin A1c  4. Psoriasis Continue routine follow up dermatology.  Mila Merry, MD  [facility] [phone] (phone) [phone] (fax)  [facility]

## 2023-03-24 DIAGNOSIS — R739 Hyperglycemia, unspecified: Secondary | ICD-10-CM | POA: Diagnosis not present

## 2023-03-24 DIAGNOSIS — I10 Essential (primary) hypertension: Secondary | ICD-10-CM | POA: Diagnosis not present

## 2023-03-25 LAB — COMPREHENSIVE METABOLIC PANEL
ALT: 15 [IU]/L (ref 0–32)
AST: 18 [IU]/L (ref 0–40)
Albumin: 4.1 g/dL (ref 3.9–4.9)
Alkaline Phosphatase: 57 [IU]/L (ref 44–121)
BUN/Creatinine Ratio: 17 (ref 12–28)
BUN: 14 mg/dL (ref 8–27)
Bilirubin Total: 0.3 mg/dL (ref 0.0–1.2)
CO2: 27 mmol/L (ref 20–29)
Calcium: 9.5 mg/dL (ref 8.7–10.3)
Chloride: 103 mmol/L (ref 96–106)
Creatinine, Ser: 0.82 mg/dL (ref 0.57–1.00)
Globulin, Total: 3.3 g/dL (ref 1.5–4.5)
Glucose: 94 mg/dL (ref 70–99)
Potassium: 3.6 mmol/L (ref 3.5–5.2)
Sodium: 144 mmol/L (ref 134–144)
Total Protein: 7.4 g/dL (ref 6.0–8.5)
eGFR: 78 mL/min/{1.73_m2} (ref >59)

## 2023-03-25 LAB — LIPID PANEL
Chol/HDL Ratio: 2.6 {ratio} (ref 0.0–4.4)
Cholesterol, Total: 167 mg/dL (ref 100–199)
HDL: 64 mg/dL (ref >39)
LDL Chol Calc (NIH): 85 mg/dL (ref 0–99)
Triglycerides: 98 mg/dL (ref 0–149)
VLDL Cholesterol Cal: 18 mg/dL (ref 5–40)

## 2023-03-25 LAB — HEMOGLOBIN A1C
Est. average glucose Bld gHb Est-mCnc: 126 mg/dL
Hgb A1c MFr Bld: 6 % — ABNORMAL HIGH (ref 4.8–5.6)

## 2023-03-25 LAB — TSH: TSH: 1.53 u[IU]/mL (ref 0.450–4.500)

## 2023-04-22 ENCOUNTER — Other Ambulatory Visit: Payer: Self-pay | Admitting: Family Medicine

## 2023-04-22 DIAGNOSIS — I10 Essential (primary) hypertension: Secondary | ICD-10-CM

## 2023-06-22 DIAGNOSIS — L102 Pemphigus foliaceous: Secondary | ICD-10-CM | POA: Diagnosis not present

## 2023-06-22 DIAGNOSIS — L400 Psoriasis vulgaris: Secondary | ICD-10-CM | POA: Diagnosis not present

## 2023-06-22 DIAGNOSIS — L2089 Other atopic dermatitis: Secondary | ICD-10-CM | POA: Diagnosis not present

## 2023-07-29 ENCOUNTER — Other Ambulatory Visit: Payer: Self-pay | Admitting: Family Medicine

## 2023-07-29 DIAGNOSIS — I10 Essential (primary) hypertension: Secondary | ICD-10-CM

## 2023-09-04 DIAGNOSIS — M1990 Unspecified osteoarthritis, unspecified site: Secondary | ICD-10-CM | POA: Diagnosis not present

## 2023-09-04 DIAGNOSIS — I10 Essential (primary) hypertension: Secondary | ICD-10-CM | POA: Diagnosis not present

## 2023-09-04 DIAGNOSIS — E785 Hyperlipidemia, unspecified: Secondary | ICD-10-CM | POA: Diagnosis not present

## 2023-09-04 DIAGNOSIS — Z6838 Body mass index (BMI) 38.0-38.9, adult: Secondary | ICD-10-CM | POA: Diagnosis not present

## 2023-09-04 DIAGNOSIS — Z008 Encounter for other general examination: Secondary | ICD-10-CM | POA: Diagnosis not present

## 2023-09-04 DIAGNOSIS — R7303 Prediabetes: Secondary | ICD-10-CM | POA: Diagnosis not present

## 2023-09-21 DIAGNOSIS — M25561 Pain in right knee: Secondary | ICD-10-CM | POA: Diagnosis not present

## 2023-09-21 DIAGNOSIS — M170 Bilateral primary osteoarthritis of knee: Secondary | ICD-10-CM | POA: Diagnosis not present

## 2023-09-21 DIAGNOSIS — M25562 Pain in left knee: Secondary | ICD-10-CM | POA: Diagnosis not present

## 2023-09-30 ENCOUNTER — Ambulatory Visit: Payer: Self-pay

## 2023-09-30 DIAGNOSIS — Z0000 Encounter for general adult medical examination without abnormal findings: Secondary | ICD-10-CM

## 2023-09-30 DIAGNOSIS — Z1231 Encounter for screening mammogram for malignant neoplasm of breast: Secondary | ICD-10-CM

## 2023-09-30 NOTE — Patient Instructions (Addendum)
 Nicole Tyler , Thank you for taking time to come for your Medicare Wellness Visit. I appreciate your ongoing commitment to your health goals. Please review the following plan we discussed and let me know if I can assist you in the future.   Referrals/Orders/Follow-Ups/Clinician Recommendations: MAMMOGRAM ORDERED  You have an order for:  []   2D Mammogram  [x]   3D Mammogram  []   Bone Density     Please call for appointment:  [facility]  [address] [place] [address] [phone] [facility] [address] [place], [place] [address] [phone] [facility] [address] [place], [place] [address] [phone]   Make sure to wear two-piece clothing.  No lotions, powders, or deodorants the day of the appointment. Make sure to bring picture ID and insurance card.  Bring list of medications you are currently taking including any supplements.   Schedule your [place] screening mammogram through MyChart!   Log into your MyChart account.  Go to 'Visit' (or 'Appointments' if on mobile App) --> Schedule an Appointment  Under 'Select a Reason for Visit' choose the Mammogram Screening option.  Complete the pre-visit questions and select the time and place that best fits your schedule.   This is a list of the screening recommended for you and due dates:  Health Maintenance  Topic Date Due   Hepatitis C Screening  Never done   Zoster (Shingles) Vaccine (1 of 2) 07/24/1974   Pneumonia Vaccine (2 of 2 - PCV) 08/03/2021   Mammogram  06/26/2022   COVID-19 Vaccine (4 - 2024-25 season) 02/08/2023   Flu Shot  01/08/2024   DTaP/Tdap/Td vaccine (2 - Td or Tdap) 08/15/2024   Medicare Annual Wellness Visit  09/29/2024   DEXA scan (bone density measurement)  10/31/2026   Colon Cancer Screening  05/31/2028   HPV Vaccine  Aged Out   Meningitis B Vaccine  Aged  Out    Advanced directives: (ACP Link)Information on Advanced Care Planning can be found at Nurse, learning disability Advance Health Care Directives Advance Health Care Directives. [email or web address]   Next Medicare Annual Wellness Visit scheduled for next year: Yes  10/05/24 @ 3:10 PM BY PHONE

## 2023-09-30 NOTE — Progress Notes (Signed)
 Subjective:   Nicole Tyler is a 68 y.o. who presents for a Medicare Wellness preventive visit.  Visit Complete: Virtual I connected with  Lorin Room on 09/30/23 by a audio enabled telemedicine application and verified that I am speaking with the correct person using two identifiers.  Patient Location: Home  Provider Location: Home Office  I discussed the limitations of evaluation and management by telemedicine. The patient expressed understanding and agreed to proceed.  Vital Signs: Because this visit was a virtual/telehealth visit, some criteria may be missing or patient reported. Any vitals not documented were not able to be obtained and vitals that have been documented are patient reported.  VideoDeclined- This patient declined Librarian, academic. Therefore the visit was completed with audio only.  Persons Participating in Visit: Patient.  AWV Questionnaire: No: Patient Medicare AWV questionnaire was not completed prior to this visit.  Cardiac Risk Factors include: advanced age (>65men, >33 women);dyslipidemia;hypertension;sedentary lifestyle;obesity (BMI >30kg/m2)     Objective:    Today's Vitals   09/30/23 1553  PainSc: 3    There is no height or weight on file to calculate BMI.     09/30/2023    3:59 PM 09/16/2022    1:21 PM 09/10/2021    2:23 PM 02/08/2016    7:00 AM 09/17/2015   10:05 AM  Advanced Directives  Does Patient Have a Medical Advance Directive? No No No No No  Would patient like information on creating a medical advance directive? No - Patient declined  No - Patient declined No - patient declined information No - patient declined information    Current Medications (verified) Outpatient Encounter Medications as of 09/30/2023  Medication Sig   amLODipine -benazepril  (LOTREL) 10-40 MG capsule TAKE 1 CAPSULE BY MOUTH DAILY   cholecalciferol (VITAMIN D3) 25 MCG (1000 UNIT) tablet Take 1,000 Units by mouth daily.   clobetasol   ointment (TEMOVATE ) 0.05 % Apply topically 2 (two) times daily.   ergocalciferol (VITAMIN D2) 1.25 MG (50000 UT) capsule Take 50,000 Units by mouth once a week. Pt takes 1 tablet twice a week.   omeprazole  (PRILOSEC) 20 MG capsule TAKE 1 CAPSULE BY MOUTH EVERY DAY Strength: 20 mg   pravastatin  (PRAVACHOL ) 20 MG tablet TAKE 1 TABLET(20 MG) BY MOUTH DAILY   triamcinolone  ointment (KENALOG) 0.1 % Apply topically 2 (two) times daily.   ciprofloxacin  (CILOXAN ) 0.3 % ophthalmic solution Place 1 drop into both eyes every 4 (four) hours while awake. (Patient not taking: Reported on 09/30/2023)   No facility-administered encounter medications on file as of 09/30/2023.    Allergies (verified) Patient has no known allergies.   History: Past Medical History:  Diagnosis Date   Benign neoplasm of colon    Benign neoplasm of sigmoid colon    Hypertension    Past Surgical History:  Procedure Laterality Date   BUNIONECTOMY Right    COLONOSCOPY WITH PROPOFOL  N/A 05/31/2021   Procedure: COLONOSCOPY WITH BIOPSY;  Surgeon: Marnee Sink, MD;  Location: [facility];  Service: Endoscopy;  Laterality: N/A;   FLEXIBLE SIGMOIDOSCOPY N/A 09/17/2015   Procedure: FLEXIBLE SIGMOIDOSCOPY;  Surgeon: Marnee Sink, MD;  Location: [facility];  Service: Endoscopy;  Laterality: N/A;   FLEXIBLE SIGMOIDOSCOPY N/A 02/08/2016   Procedure: FLEXIBLE SIGMOIDOSCOPY;  Surgeon: Marnee Sink, MD;  Location: [facility];  Service: Endoscopy;  Laterality: N/A;   HAND SURGERY  1984   has repair to lt hand secondary to [facility] injury caught in machine rollers.  POLYPECTOMY  09/17/2015   Procedure: POLYPECTOMY INTESTINAL;  Surgeon: Marnee Sink, MD;  Location: [facility];  Service: Endoscopy;;  Sigmoid colon polyp ( several pieces from the same polyp)     POLYPECTOMY N/A 05/31/2021   Procedure: POLYPECTOMY;  Surgeon: Marnee Sink, MD;  Location: [facility];  Service: Endoscopy;  Laterality: N/A;    Family History  Problem Relation Age of Onset   Hypertension Mother    Hypertension Father    Colon cancer Father    Asthma Daughter    Breast cancer Daughter    Hypertension Maternal Grandmother    Stroke Maternal Grandmother    Breast cancer Maternal Grandmother    Hypertension Daughter    Social History   Socioeconomic History   Marital status: Married    Spouse name: Not on file   Number of children: Not on file   Years of education: Not on file   Highest education level: Not on file  Occupational History    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  Tobacco Use   Smoking status: Never   Smokeless tobacco: Never  Substance and Sexual Activity   Alcohol use: No    Alcohol/week: 0.0 standard drinks of alcohol   Drug use: No   Sexual activity: Not on file  Other Topics Concern   Not on file  Social History Narrative   Not on file   Social Drivers of Health   Financial Resource Strain: Low Risk  (09/30/2023)   Overall Financial Resource Strain (CARDIA)    Difficulty of Paying Living Expenses: Not hard at all  Food Insecurity: No Food Insecurity (09/30/2023)   Hunger Vital Sign    Worried About Running Out of Food in the Last Year: Never true    Ran Out of Food in the Last Year: Never true  Transportation Needs: No Transportation Needs (09/30/2023)   PRAPARE - Administrator, Civil Service (Medical): No    Lack of Transportation (Non-Medical): No  Physical Activity: Insufficiently Active (09/30/2023)   Exercise Vital Sign    Days of Exercise per Week: 2 days    Minutes of Exercise per Session: 20 min  Stress: No Stress Concern Present (09/30/2023)   [employer] of Occupational Health - Occupational Stress Questionnaire    Feeling of Stress : Not at all  Social Connections: Moderately Isolated (09/30/2023)   Social Connection and Isolation Panel [NHANES]    Frequency of Communication with Friends and Family: More than three times a week     Frequency of Social Gatherings with Friends and Family: Once a week    Attends Religious Services: Never    Database administrator or Organizations: No    Attends Engineer, structural: Never    Marital Status: Married    Tobacco Counseling Counseling given: Not Answered    Clinical Intake:  Pre-visit preparation completed: Yes  Pain : 0-10 Pain Score: 3  Pain Type: Chronic pain Pain Location: Knee Pain Orientation: Right, Left Pain Descriptors / Indicators: Aching, Dull, Discomfort Pain Onset: More than a month ago Pain Frequency: Constant Pain Relieving Factors: gel injection  Pain Relieving Factors: gel injection  BMI - recorded: 39.1 Nutritional Status: BMI > 30  Obese Nutritional Risks: None Diabetes: No  Lab Results  Component Value Date   HGBA1C 6.0 (H) 03/24/2023   HGBA1C 5.9 (H) 02/07/2022   HGBA1C 6.0 (H) 12/30/2021     How often do you need to have someone help you  when you read instructions, pamphlets, or other written materials from your doctor or pharmacy?: 1 - Never  Interpreter Needed?: No  Information entered by :: Dellie Fergusson, LPN   Activities of Daily Living    09/30/2023    4:01 PM  In your present state of health, do you have any difficulty performing the following activities:  Hearing? 0  Vision? 0  Difficulty concentrating or making decisions? 0  Walking or climbing stairs? 0  Dressing or bathing? 0  Doing errands, shopping? 0  Preparing Food and eating ? N  Using the Toilet? N  In the past six months, have you accidently leaked urine? N  Do you have problems with loss of bowel control? N  Managing your Medications? N  Managing your Finances? N  Housekeeping or managing your Housekeeping? N    Patient Care Team: Lamon Pillow, MD as PCP - General (Family Medicine) Mickeal Aland, MD as Referring Physician (Dermatology) Marnee Sink, MD as Consulting Physician (Gastroenterology[facility] [place]  [place]  Indicate any recent Medical Services you may have received from other than [facility] providers in the past year (date may be approximate).     Assessment:   This is a routine wellness examination for Nicole Tyler.  Hearing/Vision screen Hearing Screening - Comments:: NO AIDS Vision Screening - Comments:: WEARS GLASSES ALL DAY- [email or web address]   Goals Addressed             This Visit's Progress    DIET - INCREASE WATER  INTAKE         Depression Screen     09/30/2023    3:58 PM 03/23/2023    4:19 PM 09/16/2022    1:19 PM 09/10/2021    2:22 PM 08/30/2021    1:36 PM 05/09/2020    8:43 AM 05/26/2017    8:56 AM  PHQ 2/9 Scores  PHQ - 2 Score 0 0 0 0 0 0 0  PHQ- 9 Score 0   0 0 2     Fall Risk     09/30/2023    4:01 PM 03/23/2023    4:19 PM 09/16/2022    1:16 PM 09/10/2021    2:24 PM 09/09/2021    3:23 PM  Fall Risk   Falls in the past year? 0 0 0 0 0  Number falls in past yr: 0 0 0 0 0  Injury with Fall? 0 0 0 0 0  Risk for fall due to : No Fall Risks No Fall Risks No Fall Risks No Fall Risks   Follow up Falls prevention discussed;Falls evaluation completed Falls evaluation completed Education provided;Falls prevention discussed Falls evaluation completed     MEDICARE RISK AT HOME:  Medicare Risk at Home Any stairs in or around the home?: No If so, are there any without handrails?: No Home free of loose throw rugs in walkways, pet beds, electrical cords, etc?: Yes Adequate lighting in your home to reduce risk of falls?: Yes Life alert?: No Use of a cane, walker or w/c?: No Grab bars in the bathroom?: No Shower chair or bench in shower?: Yes Elevated toilet seat or a handicapped toilet?: Yes  TIMED UP AND GO:  Was the test performed?  No  Cognitive Function: 6CIT completed        09/30/2023    4:02 PM 09/16/2022    1:24 PM  6CIT Screen  What Year? 0 points 0 points  What month? 0 points 0 points  What time? 0 points  0 points  Count back from 20 0 points 0 points  Months in  reverse 0 points 0 points  Repeat phrase 0 points 0 points  Total Score 0 points 0 points    Immunizations Immunization History  Administered Date(s) Administered   Fluad Quad(high Dose 65+) 08/30/2021, 08/13/2022   Influenza,inj,Quad PF,6+ Mos 02/19/2016, 05/26/2017, 03/14/2019, 02/22/2020   Moderna Sars-Covid-2 Vaccination 05/07/2020   PFIZER(Purple Top)SARS-COV-2 Vaccination 09/13/2019, 10/04/2019   Pneumococcal Polysaccharide-23 08/03/2020   Tdap 08/16/2014   Zoster, Live 08/27/2015    Screening Tests Health Maintenance  Topic Date Due   Hepatitis C Screening  Never done   Zoster Vaccines- Shingrix (1 of 2) 07/24/1974   Pneumonia Vaccine 51+ Years old (2 of 2 - PCV) 08/03/2021   MAMMOGRAM  06/26/2022   COVID-19 Vaccine (4 - 2024-25 season) 02/08/2023   INFLUENZA VACCINE  01/08/2024   DTaP/Tdap/Td (2 - Td or Tdap) 08/15/2024   Medicare Annual Wellness (AWV)  09/29/2024   DEXA SCAN  10/31/2026   Colonoscopy  05/31/2028   HPV VACCINES  Aged Out   Meningococcal B Vaccine  Aged Out    Health Maintenance  Health Maintenance Due  Topic Date Due   Hepatitis C Screening  Never done   Zoster Vaccines- Shingrix (1 of 2) 07/24/1974   Pneumonia Vaccine 42+ Years old (2 of 2 - PCV) 08/03/2021   MAMMOGRAM  06/26/2022   COVID-19 Vaccine (4 - 2024-25 season) 02/08/2023   Health Maintenance Items Addressed: Mammogram ordered; SHOTS UP TO DATE, COLONOSCOPY UP TO DATE, BONE DENSITY UP TO DATE  Additional Screening:  Vision Screening: Recommended annual ophthalmology exams for early detection of glaucoma and other disorders of the eye.  Dental Screening: Recommended annual dental exams for proper oral hygiene  Community Resource Referral / Chronic Care Management: CRR required this visit?  No   CCM required this visit?  No     Plan:     I have personally reviewed and noted the following in the patient's chart:   Medical and social history Use of alcohol, tobacco or  illicit drugs  Current medications and supplements including opioid prescriptions. Patient is not currently taking opioid prescriptions. Functional ability and status Nutritional status Physical activity Advanced directives List of other physicians Hospitalizations, surgeries, and ER visits in previous 12 months Vitals Screenings to include cognitive, depression, and falls Referrals and appointments  In addition, I have reviewed and discussed with patient certain preventive protocols, quality metrics, and best practice recommendations. A written personalized care plan for preventive services as well as general preventive health recommendations were provided to patient.     Pinky Bright, LPN   1/61/0960   After Visit Summary: (MyChart) Due to this being a telephonic visit, the after visit summary with patients personalized plan was offered to patient via MyChart   Notes:  MAMMOGRAM ORDERED

## 2023-11-10 ENCOUNTER — Ambulatory Visit
Admission: RE | Admit: 2023-11-10 | Discharge: 2023-11-10 | Disposition: A | Discharge status: Home / Self Care | Source: Ambulatory Visit | Attending: Family Medicine | Admitting: Family Medicine

## 2023-11-10 DIAGNOSIS — Z1231 Encounter for screening mammogram for malignant neoplasm of breast: Secondary | ICD-10-CM | POA: Insufficient documentation

## 2023-11-13 ENCOUNTER — Encounter: Payer: Self-pay | Admitting: Family Medicine

## 2023-11-13 ENCOUNTER — Ambulatory Visit: Admitting: Family Medicine

## 2023-11-13 VITALS — BP 130/80 | HR 93 | Ht 63.0 in | Wt 220.0 lb

## 2023-11-13 DIAGNOSIS — E669 Obesity, unspecified: Secondary | ICD-10-CM | POA: Diagnosis not present

## 2023-11-13 DIAGNOSIS — Z0000 Encounter for general adult medical examination without abnormal findings: Secondary | ICD-10-CM | POA: Diagnosis not present

## 2023-11-13 DIAGNOSIS — E782 Mixed hyperlipidemia: Secondary | ICD-10-CM | POA: Diagnosis not present

## 2023-11-13 DIAGNOSIS — Z23 Encounter for immunization: Secondary | ICD-10-CM

## 2023-11-13 DIAGNOSIS — R7303 Prediabetes: Secondary | ICD-10-CM | POA: Diagnosis not present

## 2023-11-13 DIAGNOSIS — I10 Essential (primary) hypertension: Secondary | ICD-10-CM | POA: Diagnosis not present

## 2023-11-13 MED ORDER — PRAVASTATIN SODIUM 20 MG PO TABS: 20.0000 mg | ORAL_TABLET | Freq: Every day | ORAL | 4 refills | Status: AC

## 2023-11-13 NOTE — Patient Instructions (Signed)
 Nicole Tyler  Please review the attached list of medications and notify my office if there are any errors.   . Please bring all of your medications to every appointment so we can make sure that our medication list is the same as yours.

## 2023-11-14 LAB — HEMOGLOBIN A1C
Est. average glucose Bld gHb Est-mCnc: 123 mg/dL
Hgb A1c MFr Bld: 5.9 % — ABNORMAL HIGH (ref 4.8–5.6)

## 2023-11-14 LAB — COMPREHENSIVE METABOLIC PANEL WITH GFR
ALT: 13 IU/L (ref 0–32)
AST: 17 IU/L (ref 0–40)
Albumin: 4.3 g/dL (ref 3.9–4.9)
Alkaline Phosphatase: 62 IU/L (ref 44–121)
BUN/Creatinine Ratio: 16 (ref 12–28)
BUN: 14 mg/dL (ref 8–27)
Bilirubin Total: 0.3 mg/dL (ref 0.0–1.2)
CO2: 24 mmol/L (ref 20–29)
Calcium: 9.8 mg/dL (ref 8.7–10.3)
Chloride: 102 mmol/L (ref 96–106)
Creatinine, Ser: 0.87 mg/dL (ref 0.57–1.00)
Globulin, Total: 3 g/dL (ref 1.5–4.5)
Glucose: 81 mg/dL (ref 70–99)
Potassium: 3.9 mmol/L (ref 3.5–5.2)
Sodium: 141 mmol/L (ref 134–144)
Total Protein: 7.3 g/dL (ref 6.0–8.5)
eGFR: 73 mL/min/{1.73_m2} (ref >59)

## 2023-11-14 LAB — CBC
Hematocrit: 38.5 % (ref 34.0–46.6)
Hemoglobin: 12.6 g/dL (ref 11.1–15.9)
MCH: 29.9 pg (ref 26.6–33.0)
MCHC: 32.7 g/dL (ref 31.5–35.7)
MCV: 91 fL (ref 79–97)
Platelets: 219 10*3/uL (ref 150–450)
RBC: 4.21 x10E6/uL (ref 3.77–5.28)
RDW: 13.8 % (ref 11.7–15.4)
WBC: 7.4 10*3/uL (ref 3.4–10.8)

## 2023-11-14 LAB — LIPID PANEL
Chol/HDL Ratio: 4.2 ratio (ref 0.0–4.4)
Cholesterol, Total: 216 mg/dL — ABNORMAL HIGH (ref 100–199)
HDL: 52 mg/dL (ref >39)
LDL Chol Calc (NIH): 137 mg/dL — ABNORMAL HIGH (ref 0–99)
Triglycerides: 152 mg/dL — ABNORMAL HIGH (ref 0–149)
VLDL Cholesterol Cal: 27 mg/dL (ref 5–40)

## 2023-11-30 ENCOUNTER — Ambulatory Visit: Payer: Self-pay | Admitting: Family Medicine

## 2023-12-01 NOTE — Progress Notes (Signed)
 Complete physical exam   Patient: Nicole Tyler   DOB: 1955/09/15   68 y.o. Female  MRN: [id number] Visit Date: 11/13/2023  Today's healthcare provider: Nancyann Perry, MD   Chief Complaint  Patient presents with   Annual Exam   Subjective    Discussed the use of AI scribe software for clinical note transcription with the patient, who gave verbal consent to proceed.  History of Present Illness   Nicole Tyler is a 68 year old female who presents for an annual physical exam. She is accompanied by her husband.  She feels generally well and does not have specific concerns beyond routine checks. She is currently taking Lotrel for hypertension but has not checked her blood pressure at home for about two months, with previous readings around 140 mmHg. No chest pain, heart flutter, or shortness of breath. She uses reflux medication as needed, particularly with trigger foods like pizza, and finds it effective.  She received the Pneumovax vaccine a couple of years ago and is due for the Prevnar vaccine. A mammogram was performed recently, and she is awaiting results. She is not due for another colonoscopy until 2029 and had a normal bone density test in 2023.  She experiences persistent knee pain despite a gel injection in April and previous cortisone shots. Topical treatments like Voltaren are used, and she limits walking to manage pain. Occasional swelling in her feet and ankles is noted, attributed to her knees.         Past Medical History:  Diagnosis Date   Benign neoplasm of colon    Benign neoplasm of sigmoid colon    Hypertension    Past Surgical History:  Procedure Laterality Date   BUNIONECTOMY Right    COLONOSCOPY WITH PROPOFOL  N/A 05/31/2021   Procedure: COLONOSCOPY WITH BIOPSY;  Surgeon: Jinny Carmine, MD;  Location: [facility];  Service: Endoscopy;  Laterality: N/A;   FLEXIBLE SIGMOIDOSCOPY N/A 09/17/2015   Procedure: FLEXIBLE SIGMOIDOSCOPY;  Surgeon: Carmine Jinny, MD;  Location: [facility];  Service: Endoscopy;  Laterality: N/A;   FLEXIBLE SIGMOIDOSCOPY N/A 02/08/2016   Procedure: FLEXIBLE SIGMOIDOSCOPY;  Surgeon: Carmine Jinny, MD;  Location: [facility];  Service: Endoscopy;  Laterality: N/A;   HAND SURGERY  1984   has repair to lt hand secondary to [facility] injury caught in machine rollers.   POLYPECTOMY  09/17/2015   Procedure: POLYPECTOMY INTESTINAL;  Surgeon: Carmine Jinny, MD;  Location: [facility];  Service: Endoscopy;;  Sigmoid colon polyp ( several pieces from the same polyp)     POLYPECTOMY N/A 05/31/2021   Procedure: POLYPECTOMY;  Surgeon: Jinny Carmine, MD;  Location: [facility];  Service: Endoscopy;  Laterality: N/A;   Social History   Socioeconomic History   Marital status: Married    Spouse name: Not on file   Number of children: Not on file   Years of education: Not on file   Highest education level: Not on file  Occupational History    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  Tobacco Use   Smoking status: Never   Smokeless tobacco: Never  Substance and Sexual Activity   Alcohol use: No    Alcohol/week: 0.0 standard drinks of alcohol   Drug use: No   Sexual activity: Not on file  Other Topics Concern   Not on file  Social History Narrative   Not on file   Social Drivers of Health   Financial Resource Strain: Low Risk  (  09/30/2023)   Overall Financial Resource Strain (CARDIA)    Difficulty of Paying Living Expenses: Not hard at all  Food Insecurity: No Food Insecurity (09/30/2023)   Hunger Vital Sign    Worried About Running Out of Food in the Last Year: Never true    Ran Out of Food in the Last Year: Never true  Transportation Needs: No Transportation Needs (09/30/2023)   PRAPARE - Administrator, Civil Service (Medical): No    Lack of Transportation (Non-Medical): No  Physical Activity: Insufficiently Active (09/30/2023)   Exercise Vital Sign    Days of Exercise per Week: 2  days    Minutes of Exercise per Session: 20 min  Stress: No Stress Concern Present (09/30/2023)   [employer] of Occupational Health - Occupational Stress Questionnaire    Feeling of Stress : Not at all  Social Connections: Moderately Isolated (09/30/2023)   Social Connection and Isolation Panel    Frequency of Communication with Friends and Family: More than three times a week    Frequency of Social Gatherings with Friends and Family: Once a week    Attends Religious Services: Never    Database administrator or Organizations: No    Attends Banker Meetings: Never    Marital Status: Married  Catering manager Violence: Not At Risk (09/30/2023)   Humiliation, Afraid, Rape, and Kick questionnaire    Fear of Current or Ex-Partner: No    Emotionally Abused: No    Physically Abused: No    Sexually Abused: No   Family Status  Relation Name Status   Mother  Deceased at age 80   Father  Deceased at age 46       Died of colon cancer   Sister  Alive   Daughter  Deceased   MGM  Deceased   Daughter  Alive   Sister  Alive   Sister  Alive   Sister  Alive   Sister  Alive  No partnership data on file   Family History  Problem Relation Age of Onset   Hypertension Mother    Hypertension Father    Colon cancer Father    Asthma Daughter    Breast cancer Daughter    Hypertension Maternal Grandmother    Stroke Maternal Grandmother    Breast cancer Maternal Grandmother    Hypertension Daughter    No Known Allergies  Patient Care Team: Gasper Nancyann BRAVO, MD as PCP - General (Family Medicine) Rosalind Massie PARAS, MD as Referring Physician (Dermatology) Jinny Carmine, MD as Consulting Physician (Gastroenterology) [facility], [facility]   Medications: Outpatient Medications Prior to Visit  Medication Sig   amLODipine -benazepril  (LOTREL) 10-40 MG capsule TAKE 1 CAPSULE BY MOUTH DAILY   clobetasol  ointment (TEMOVATE ) 0.05 % Apply topically 2 (two) times daily.    ergocalciferol (VITAMIN D2) 1.25 MG (50000 UT) capsule Take 50,000 Units by mouth once a week. Pt takes 1 tablet twice a week.   meloxicam  (MOBIC ) 15 MG tablet    omeprazole  (PRILOSEC) 20 MG capsule TAKE 1 CAPSULE BY MOUTH EVERY DAY Strength: 20 mg   triamcinolone  ointment (KENALOG) 0.1 % Apply topically 2 (two) times daily.   Upadacitinib ER (RINVOQ) 15 MG TB24    pravastatin  (PRAVACHOL ) 20 MG tablet TAKE 1 TABLET(20 MG) BY MOUTH DAILY   [DISCONTINUED] cholecalciferol (VITAMIN D3) 25 MCG (1000 UNIT) tablet Take 1,000 Units by mouth daily.   No facility-administered medications prior to visit.    Review of  Systems  Constitutional:  Negative for appetite change, chills, fatigue and fever.  Respiratory:  Negative for chest tightness and shortness of breath.   Cardiovascular:  Negative for chest pain and palpitations.  Gastrointestinal:  Negative for abdominal pain, nausea and vomiting.  Neurological:  Negative for dizziness and weakness.      Objective    BP 130/80   Pulse 93   Ht 5' 3 (1.6 m)   Wt 220 lb (99.8 kg)   SpO2 98%   BMI 38.97 kg/m    Physical Exam  General Appearance:     female. Alert, cooperative, in no acute distress, apMildly obesepears stated age   Head:    Normocephalic, without obvious abnormality, atraumatic  Eyes:    PERRL, conjunctiva/corneas clear, EOM's intact, fundi    benign, both eyes  Ears:    Normal TM's and external ear canals, both ears  Nose:   Nares normal, septum midline, mucosa normal, no drainage    or sinus tenderness  Throat:   Lips, mucosa, and tongue normal; teeth and gums normal  Neck:   Supple, symmetrical, trachea midline, no adenopathy;    thyroid :  no enlargement/tenderness/nodules; no carotid   bruit or JVD  Back:     Symmetric, no curvature, ROM normal, no CVA tenderness  Lungs:     Clear to auscultation bilaterally, respirations unlabored  Chest Wall:    No tenderness or deformity   Heart:    Normal heart rate. Normal  rhythm. No murmurs, rubs, or gallops.   Breast Exam:    deferred  Abdomen:     Soft, non-tender, bowel sounds active all four quadrants,    no masses, no organomegaly  Pelvic:    deferred  Extremities:   All extremities are intact. No cyanosis or edema  Pulses:   2+ and symmetric all extremities  Skin:   Skin color, texture, turgor normal, no rashes or lesions  Lymph nodes:   Cervical, supraclavicular, and axillary nodes normal  Neurologic:   CNII-XII intact, normal strength, sensation and reflexes    throughout    Physical Exam   VITALS: BP- 130/80 CHEST: Lungs clear to auscultation bilaterally. CARDIOVASCULAR: Heart sounds normal. EXTREMITIES: No edema in extremities.     Last depression screening scores    11/13/2023    3:49 PM 09/30/2023    3:58 PM 03/23/2023    4:19 PM  PHQ 2/9 Scores  PHQ - 2 Score 0 0 0  PHQ- 9 Score 1 0    Last fall risk screening    11/13/2023    3:49 PM  Fall Risk   Falls in the past year? 0  Injury with Fall? 0  Risk for fall due to : No Fall Risks   Last Audit-C alcohol use screening    09/30/2023    3:57 PM  Alcohol Use Disorder Test (AUDIT)  1. How often do you have a drink containing alcohol? 0  2. How many drinks containing alcohol do you have on a typical day when you are drinking? 0  3. How often do you have six or more drinks on one occasion? 0  AUDIT-C Score 0   A score of 3 or more in women, and 4 or more in men indicates increased risk for alcohol abuse, EXCEPT if all of the points are from question 1     Assessment & Plan    Routine Health Maintenance and Physical Exam  Exercise Activities and Dietary recommendations  Goals  DIET - EAT MORE FRUITS AND VEGETABLES     DIET - INCREASE WATER  INTAKE        Immunization History  Administered Date(s) Administered   Fluad Quad(high Dose 65+) 08/30/2021, 08/13/2022   Influenza,inj,Quad PF,6+ Mos 02/19/2016, 05/26/2017, 03/14/2019, 02/22/2020   Moderna Sars-Covid-2  Vaccination 05/07/2020   PFIZER(Purple Top)SARS-COV-2 Vaccination 09/13/2019, 10/04/2019   PNEUMOCOCCAL CONJUGATE-20 11/13/2023   Pneumococcal Polysaccharide-23 08/03/2020   Tdap 08/16/2014   Zoster, Live 08/27/2015    Health Maintenance  Topic Date Due   Hepatitis C Screening  Never done   Zoster Vaccines- Shingrix (1 of 2) 07/24/1974   COVID-19 Vaccine (4 - 2024-25 season) 02/08/2023   INFLUENZA VACCINE  01/08/2024   DTaP/Tdap/Td (2 - Td or Tdap) 08/15/2024   Medicare Annual Wellness (AWV)  09/29/2024   MAMMOGRAM  11/09/2025   DEXA SCAN  10/31/2026   Colonoscopy  05/31/2028   Pneumococcal Vaccine: 50+ Years  Completed   Hepatitis B Vaccines  Aged Out   HPV VACCINES  Aged Out   Meningococcal B Vaccine  Aged Out    Discussed health benefits of physical activity, and encouraged her to engage in regular exercise appropriate for her age and condition.     Due for Prevnar 20. Recent mammogram pending. Normal bone density in 2023. Colonoscopy due 2029. Discussed Prevnar 20 benefits. - Administer Prevnar 20 vaccine. - Check mammogram results when available. - Schedule next bone density test in 2028.   Osteoarthritis of the knees Persistent knee pain despite previous treatments. Considering knee replacement due to pain and functional limitations. Aware of insurance coverage. - Consider referral to orthopedic surgery for knee replacement evaluation.  Hypertension Hypertension controlled with Lotrel. In-office BP 130/80 mmHg. No symptoms reported. - Continue Lotrel. - Encourage regular home BP monitoring.   Hyperlipidemia - Continue pravastatin  which she is tolerating well.    Prediabetes - Check A1c  Gastroesophageal Reflux Disease (GERD) GERD managed effectively with as-needed medication before trigger foods. - Continue current as-needed reflux medication regimen.          Nancyann Perry, MD  [facility] [phone]  (phone) [phone] (fax)  [facility]

## 2023-12-15 DIAGNOSIS — M170 Bilateral primary osteoarthritis of knee: Secondary | ICD-10-CM | POA: Diagnosis not present

## 2023-12-15 DIAGNOSIS — M25561 Pain in right knee: Secondary | ICD-10-CM | POA: Diagnosis not present

## 2023-12-15 DIAGNOSIS — M25562 Pain in left knee: Secondary | ICD-10-CM | POA: Diagnosis not present

## 2023-12-21 DIAGNOSIS — Z79899 Other long term (current) drug therapy: Secondary | ICD-10-CM | POA: Diagnosis not present

## 2023-12-21 DIAGNOSIS — L400 Psoriasis vulgaris: Secondary | ICD-10-CM | POA: Diagnosis not present

## 2023-12-21 DIAGNOSIS — L102 Pemphigus foliaceous: Secondary | ICD-10-CM | POA: Diagnosis not present

## 2023-12-21 DIAGNOSIS — Z111 Encounter for screening for respiratory tuberculosis: Secondary | ICD-10-CM | POA: Diagnosis not present

## 2023-12-21 DIAGNOSIS — L2089 Other atopic dermatitis: Secondary | ICD-10-CM | POA: Diagnosis not present

## 2023-12-22 DIAGNOSIS — Z79899 Other long term (current) drug therapy: Secondary | ICD-10-CM | POA: Diagnosis not present

## 2024-01-01 DIAGNOSIS — M170 Bilateral primary osteoarthritis of knee: Secondary | ICD-10-CM | POA: Diagnosis not present

## 2024-01-12 DIAGNOSIS — M170 Bilateral primary osteoarthritis of knee: Secondary | ICD-10-CM | POA: Diagnosis not present

## 2024-02-23 DIAGNOSIS — G8918 Other acute postprocedural pain: Secondary | ICD-10-CM | POA: Diagnosis not present

## 2024-02-23 DIAGNOSIS — Z6839 Body mass index (BMI) 39.0-39.9, adult: Secondary | ICD-10-CM | POA: Diagnosis not present

## 2024-02-23 DIAGNOSIS — E7800 Pure hypercholesterolemia, unspecified: Secondary | ICD-10-CM | POA: Diagnosis not present

## 2024-02-23 DIAGNOSIS — M1711 Unilateral primary osteoarthritis, right knee: Secondary | ICD-10-CM | POA: Diagnosis not present

## 2024-02-23 DIAGNOSIS — I10 Essential (primary) hypertension: Secondary | ICD-10-CM | POA: Diagnosis not present

## 2024-02-23 DIAGNOSIS — M25761 Osteophyte, right knee: Secondary | ICD-10-CM | POA: Diagnosis not present

## 2024-02-23 DIAGNOSIS — E669 Obesity, unspecified: Secondary | ICD-10-CM | POA: Diagnosis not present

## 2024-02-29 DIAGNOSIS — M25561 Pain in right knee: Secondary | ICD-10-CM | POA: Diagnosis not present

## 2024-02-29 DIAGNOSIS — M25661 Stiffness of right knee, not elsewhere classified: Secondary | ICD-10-CM | POA: Diagnosis not present

## 2024-03-04 ENCOUNTER — Ambulatory Visit
Admission: RE | Admit: 2024-03-04 | Discharge: 2024-03-04 | Disposition: A | Discharge status: Home / Self Care | Source: Ambulatory Visit | Attending: Orthopedic Surgery | Admitting: Orthopedic Surgery

## 2024-03-04 ENCOUNTER — Other Ambulatory Visit: Payer: Self-pay | Admitting: Orthopedic Surgery

## 2024-03-04 DIAGNOSIS — M1711 Unilateral primary osteoarthritis, right knee: Secondary | ICD-10-CM | POA: Diagnosis not present

## 2024-03-04 DIAGNOSIS — Z4889 Encounter for other specified surgical aftercare: Secondary | ICD-10-CM | POA: Diagnosis not present

## 2024-03-04 DIAGNOSIS — R2241 Localized swelling, mass and lump, right lower limb: Secondary | ICD-10-CM

## 2024-03-11 DIAGNOSIS — M25561 Pain in right knee: Secondary | ICD-10-CM | POA: Diagnosis not present

## 2024-03-11 DIAGNOSIS — M25661 Stiffness of right knee, not elsewhere classified: Secondary | ICD-10-CM | POA: Diagnosis not present

## 2024-03-18 DIAGNOSIS — M25661 Stiffness of right knee, not elsewhere classified: Secondary | ICD-10-CM | POA: Diagnosis not present

## 2024-03-18 DIAGNOSIS — M25561 Pain in right knee: Secondary | ICD-10-CM | POA: Diagnosis not present

## 2024-05-06 ENCOUNTER — Telehealth: Payer: Self-pay

## 2024-10-05 ENCOUNTER — Ambulatory Visit
